# Patient Record
Sex: Male | Born: 1980 | Hispanic: Yes | Marital: Married | Smoking: Former smoker
Health system: Southern US, Community
[De-identification: ages and names within clinical notes are randomized; demographics above are authoritative.]

## PROBLEM LIST (undated history)

## (undated) DIAGNOSIS — F339 Major depressive disorder, recurrent, unspecified: Secondary | ICD-10-CM

## (undated) DIAGNOSIS — F419 Anxiety disorder, unspecified: Secondary | ICD-10-CM

## (undated) HISTORY — PX: KNEE SURGERY: SHX244

## (undated) HISTORY — DX: Major depressive disorder, recurrent, unspecified: F33.9

## (undated) HISTORY — DX: Anxiety disorder, unspecified: F41.9

## (undated) HISTORY — PX: HAND SURGERY: SHX662

---

## 2014-09-08 ENCOUNTER — Ambulatory Visit (INDEPENDENT_AMBULATORY_CARE_PROVIDER_SITE_OTHER): Payer: 59 | Admitting: Family Medicine

## 2014-09-08 ENCOUNTER — Encounter: Payer: Self-pay | Admitting: Family Medicine

## 2014-09-08 ENCOUNTER — Ambulatory Visit (HOSPITAL_BASED_OUTPATIENT_CLINIC_OR_DEPARTMENT_OTHER)
Admission: RE | Admit: 2014-09-08 | Discharge: 2014-09-08 | Disposition: A | Discharge status: Home / Self Care | Payer: 59 | Source: Ambulatory Visit | Attending: Family Medicine | Admitting: Family Medicine

## 2014-09-08 VITALS — BP 126/80 | HR 71 | Ht 72.0 in | Wt 205.0 lb

## 2014-09-08 DIAGNOSIS — M546 Pain in thoracic spine: Secondary | ICD-10-CM

## 2014-09-08 DIAGNOSIS — M549 Dorsalgia, unspecified: Secondary | ICD-10-CM

## 2014-09-08 NOTE — Patient Instructions (Signed)
You have an insertional thoracic strain. Take ibuprofen or aleve as needed. Start physical therapy and do home exercises on days you don't go to therapy. Follow up with me in 6 weeks for reevaluation.  Call me instead if you're doing well. Consider MRI if not improving as expected.

## 2014-09-13 ENCOUNTER — Encounter: Payer: Self-pay | Admitting: Family Medicine

## 2014-09-13 DIAGNOSIS — M549 Dorsalgia, unspecified: Secondary | ICD-10-CM | POA: Insufficient documentation

## 2014-09-13 NOTE — Progress Notes (Signed)
Patient ID: Devin Hoffman, male   DOB: 01/01/1981, [age] y.o.   MRN: [id number]  PCP: No primary provider on file.  Subjective:   HPI: Patient is a 33 y.o. male here for upper back pain.  Patient reports for about 2 months he has had pain between his shoulder blades. No known injury. Started shortly after he cleaned carpets in a house. Tried aleve without much benefit. No swelling or bruising. Has not tried any rehab for this. Difficult to reproduce pain but does hurt with certain movements.  History reviewed. No pertinent past medical history.  No current outpatient prescriptions on file prior to visit.   No current facility-administered medications on file prior to visit.    Past Surgical History  Procedure Laterality Date  . Knee surgery    . Hand surgery Left     No Known Allergies  History   Social History  . Marital Status: Married    Spouse Name: N/A    Number of Children: N/A  . Years of Education: N/A   Occupational History  . Not on file.   Social History Main Topics  . Smoking status: Current Some Day Smoker  . Smokeless tobacco: Not on file  . Alcohol Use: Not on file  . Drug Use: Not on file  . Sexual Activity: Not on file   Other Topics Concern  . Not on file   Social History Narrative  . No narrative on file    No family history on file.  BP 126/80  Pulse 71  Ht 6' (1.829 m)  Wt 205 lb (92.987 kg)  BMI 27.80 kg/m2  Review of Systems: See HPI above.    Objective:  Physical Exam:  Gen: NAD  Back: No gross deformity, scoliosis. TTP midline around T5 without stepoffs.  No paraspinal tenderness. FROM without pain. Strength 5/5 all upper extremity muscle groups.   Sensation intact to light touch bilaterally.    Assessment & Plan:  1. Upper back pain - radiographs negative.  Unusual in that pain seems focal and bony though no abnormalities.  He also denied sweats, chills, fevers, other medical problems.  One family member  possibly with rheumatoid arthritis but no other autoimmune disease he's aware of.  We will start with conservative treatment for insertional thoracic strain with nsaids, physical therapy.  Follow up with me in 6 weeks for reevaluation.  Consider MRI if not improving.

## 2014-09-13 NOTE — Assessment & Plan Note (Signed)
radiographs negative.  Unusual in that pain seems focal and bony though no abnormalities.  He also denied sweats, chills, fevers, other medical problems.  One family member possibly with rheumatoid arthritis but no other autoimmune disease he's aware of.  We will start with conservative treatment for insertional thoracic strain with nsaids, physical therapy.  Follow up with me in 6 weeks for reevaluation.  Consider MRI if not improving.

## 2014-09-14 ENCOUNTER — Ambulatory Visit: Payer: 59 | Attending: Family Medicine

## 2014-09-14 DIAGNOSIS — M546 Pain in thoracic spine: Secondary | ICD-10-CM | POA: Insufficient documentation

## 2014-09-14 DIAGNOSIS — Z5189 Encounter for other specified aftercare: Secondary | ICD-10-CM | POA: Insufficient documentation

## 2014-09-21 ENCOUNTER — Ambulatory Visit: Payer: 59 | Admitting: Rehabilitation

## 2014-09-21 DIAGNOSIS — Z5189 Encounter for other specified aftercare: Secondary | ICD-10-CM | POA: Diagnosis not present

## 2014-09-28 ENCOUNTER — Ambulatory Visit: Payer: 59 | Attending: Family Medicine | Admitting: Rehabilitation

## 2014-09-28 DIAGNOSIS — M546 Pain in thoracic spine: Secondary | ICD-10-CM | POA: Insufficient documentation

## 2014-09-28 DIAGNOSIS — Z5189 Encounter for other specified aftercare: Secondary | ICD-10-CM | POA: Insufficient documentation

## 2014-09-30 ENCOUNTER — Ambulatory Visit: Payer: 59 | Admitting: Rehabilitation

## 2014-09-30 DIAGNOSIS — Z5189 Encounter for other specified aftercare: Secondary | ICD-10-CM | POA: Diagnosis present

## 2014-09-30 DIAGNOSIS — M546 Pain in thoracic spine: Secondary | ICD-10-CM | POA: Diagnosis not present

## 2014-10-05 ENCOUNTER — Ambulatory Visit: Payer: 59 | Admitting: Rehabilitation

## 2014-10-07 ENCOUNTER — Ambulatory Visit: Payer: 59 | Admitting: Rehabilitation

## 2014-10-07 DIAGNOSIS — Z5189 Encounter for other specified aftercare: Secondary | ICD-10-CM | POA: Diagnosis not present

## 2014-10-12 ENCOUNTER — Ambulatory Visit: Payer: 59

## 2014-10-14 ENCOUNTER — Ambulatory Visit: Payer: 59 | Admitting: Rehabilitation

## 2014-10-19 ENCOUNTER — Ambulatory Visit: Payer: 59 | Admitting: Physical Therapy

## 2014-10-28 ENCOUNTER — Ambulatory Visit: Payer: 59 | Attending: Family Medicine | Admitting: Physical Therapy

## 2014-10-28 DIAGNOSIS — Z5189 Encounter for other specified aftercare: Secondary | ICD-10-CM | POA: Insufficient documentation

## 2014-10-28 DIAGNOSIS — M546 Pain in thoracic spine: Secondary | ICD-10-CM | POA: Insufficient documentation

## 2015-05-05 ENCOUNTER — Encounter (HOSPITAL_COMMUNITY): Payer: Self-pay | Admitting: Clinical

## 2015-05-05 ENCOUNTER — Encounter (INDEPENDENT_AMBULATORY_CARE_PROVIDER_SITE_OTHER): Payer: Self-pay

## 2015-05-05 ENCOUNTER — Ambulatory Visit (INDEPENDENT_AMBULATORY_CARE_PROVIDER_SITE_OTHER): Payer: 59 | Admitting: Clinical

## 2015-05-05 DIAGNOSIS — F411 Generalized anxiety disorder: Secondary | ICD-10-CM | POA: Diagnosis not present
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
## 2015-05-17 ENCOUNTER — Ambulatory Visit (INDEPENDENT_AMBULATORY_CARE_PROVIDER_SITE_OTHER): Payer: 59 | Admitting: Clinical

## 2015-05-17 ENCOUNTER — Encounter (HOSPITAL_COMMUNITY): Payer: Self-pay | Admitting: Clinical

## 2015-05-17 DIAGNOSIS — F411 Generalized anxiety disorder: Secondary | ICD-10-CM | POA: Diagnosis not present

## 2015-05-17 NOTE — Progress Notes (Signed)
   THERAPIST PROGRESS NOTE  Session Time: 7:02 - 8:00  Participation Level: Active  Behavioral Response: CasualAlertAnxious   Type of Therapy: Individual Therapy  Treatment Goals addressed: improve psychiatric symptoms, stress management, reduction of anxiety producing thoughts, improve unhelpful thinking patterns  Interventions: CBT, Motivational Interviewing and Other: Mindfulness and Grounding  Summary: Devin Hoffman is a 34 y.o. male who presents with Generalized Anxiety Disorder  Suicidal/Homicidal: No -without intent/plan  Therapist Response: Devin Hoffman met with clinician for an individual session. He discussed his psychiatric symptoms and  his current life events. Devin Hoffman shared that it had been his wedding anniversary and that he celebrated fathers day. Devin Hoffman shared what had gone well and also where he experienced a lot of anxiety. Devin Hoffman shared how his thoughts about perfection got in the way of "enjoying things as he should have" Devin Hoffman and clinician discussed his expectations versus the reality of what happened. Clinician introduced some basic cbt concepts. Devin Hoffman and clinician used the example to show that different interpretations of events might produce different emotions and different behaviors. Devin Hoffman and clinician discussed the challenge of catching our thoughts. Clinician introduced grounding and mindfulness techniques. Clinician explained the process, purpose, and practice. Devin Hoffman and clinician discussed some of the techniques in detail and then practiced them together. Devin Hoffman agreed to practice the techniques daily until next session. Devin Hoffman said he felt better knowing there was action he could take to improve.  Plan: Return again in 1 weeks.  Diagnosis: Axis I: generalized Anxiety Disorder    Devin Hoffman A, LCSW 05/17/2015

## 2015-05-25 ENCOUNTER — Ambulatory Visit (HOSPITAL_COMMUNITY): Payer: Self-pay | Admitting: Clinical

## 2015-06-14 ENCOUNTER — Encounter (HOSPITAL_COMMUNITY): Payer: Self-pay | Admitting: Clinical

## 2015-06-14 ENCOUNTER — Ambulatory Visit (INDEPENDENT_AMBULATORY_CARE_PROVIDER_SITE_OTHER): Payer: 59 | Admitting: Clinical

## 2015-06-14 DIAGNOSIS — F411 Generalized anxiety disorder: Secondary | ICD-10-CM

## 2015-06-14 NOTE — Progress Notes (Signed)
   THERAPIST PROGRESS NOTE  Session Time: 8:03 - 9:00  Participation Level: Active  Behavioral Response: CasualAlertAnxious  Type of Therapy: Individual Therapy  Treatment Goals addressed: improve psychiatric symptoms,  reduction of anxiety producing thoughts, improve unhelpful thinking patterns  Interventions: CBT, Motivational Interviewing and Other: Mindfulness and Grounding  Summary: Tarry Fountain is a 34 y.o. male who presents with Generalized Anxiety Disorder  Suicidal/Homicidal: No -without intent/plan  Therapist Response: Leland met with clinician for an individual session. He discussed his psychiatric symptoms, his current life events and his homework. Jhamir reported that he felt he has made a little bit of progress on his anxiety. He shared that he was starting to recognize some of his anxiety inducing thoughts. 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 He shared that he noticed when he was successful with his skills his anxiety dissipated. Elgie shared about an event that caused his anxiety to increase ( about his house and order). He shared his negative automatic thoughts about event. Client and clinician discussed how his anxiety affected his body. Client and clinician discussed when he might have formed those thoughts - an internal rule. Benedict recognized that they were given to him by his mother as a child. Client and clinician discussed whether or not he had seen others live happy without the rule. Client and clinician discussed whether or not he agreed with the rule.Client and clinician discussed the process of challenging internal rules. Graison agreed to consider the "rule" and note his thoughts for homework. Rosbel agreed to continue his grounding and mindfulness homework as well as complete another packet before next session. Jedaiah reported that his wife is recognizing his efforts to improve.   Plan: Return again in 1  weeks.  Diagnosis: Axis I: generalized Anxiety Disorder    Kentrell Hallahan A, LCSW 06/14/2015

## 2015-06-29 ENCOUNTER — Ambulatory Visit (HOSPITAL_COMMUNITY): Payer: Self-pay | Admitting: Clinical

## 2015-07-26 ENCOUNTER — Ambulatory Visit (HOSPITAL_COMMUNITY): Payer: Self-pay | Admitting: Clinical

## 2015-08-10 ENCOUNTER — Ambulatory Visit (INDEPENDENT_AMBULATORY_CARE_PROVIDER_SITE_OTHER): Payer: 59 | Admitting: Clinical

## 2015-08-10 DIAGNOSIS — F411 Generalized anxiety disorder: Secondary | ICD-10-CM

## 2015-08-16 NOTE — Progress Notes (Signed)
   THERAPIST PROGRESS NOTE  Session Time: 4:35 -5:31  Participation Level: Active  Behavioral Response: Casual and NeatAlertAnxious  Type of Therapy: Individual Therapy  Treatment Goals addressed: improve psychiatric symptoms,  reduction of anxiety producing thoughts, improve unhelpful thinking patterns  Interventions: CBT, Motivational Interviewing and Other: Mindfulness and Grounding  Summary: Maykel Reitter is a 34 y.o. male who presents with Generalized Anxiety Disorder  Suicidal/Homicidal: No -without intent/plan  Therapist Response: Maxey met with clinician for an individual session. He discussed his psychiatric symptoms, his current life events and his homework. Dereck shared that it had been some time since his last appointment due to scheduling conflicts. He shared that he has continued to do his grounding and mindfulness techniques, but did not bring his packet with him. He shared that he has continued to practice the techniques leaned in therapy. He stated that he continues to have anxiety producing thoughts but he is getting better at recognizing them. Juddson shared about his successes as well as his areas for improvement. Jewett shared that when he has been calmer and less anxious his wife has responded by being neater. He stated that this additionally decreased his anxiety. Chrisean shared about his child (69 year old son) He shared about an event that caused him a lot of anxiety. Client and clinician discussed his negative automatic thoughts. Client and clinician discussed the evidence for and against the thoughts. Jaylun and clinician discussed how this related to last sessions discussion about his "rules". Slate formulated healthier alternative ways to veiw the event.  Aldean shared his insight about how his childhood has shaped his perceptions. Harvey agreed to continue his homework until next session. Jeb shared that he feels like he is making progress.     Plan: Return  again in 1- 2 weeks.  Diagnosis: Axis I: generalized Anxiety Disorder    Powell,Frances A, LCSW 08/16/2015

## 2015-08-17 ENCOUNTER — Encounter (HOSPITAL_COMMUNITY): Payer: Self-pay | Admitting: Clinical

## 2015-09-12 ENCOUNTER — Encounter (HOSPITAL_COMMUNITY): Payer: Self-pay | Admitting: Clinical

## 2015-09-12 ENCOUNTER — Ambulatory Visit (INDEPENDENT_AMBULATORY_CARE_PROVIDER_SITE_OTHER): Payer: 59 | Admitting: Clinical

## 2015-09-12 DIAGNOSIS — F411 Generalized anxiety disorder: Secondary | ICD-10-CM | POA: Diagnosis not present

## 2015-09-12 NOTE — Progress Notes (Signed)
   THERAPIST PROGRESS NOTE  Session Time: 4:33- 5:28  Participation Level: Active  Behavioral Response: CasualAlertNA  Type of Therapy: Individual Therapy  Treatment Goals addressed: improve psychiatric symptoms, stress management, reduction of anxiety producing thoughts, improve unhelpful thinking patterns  Interventions: CBT, Motivational Interviewing   Summary: Devin Hoffman is a 34 y.o. male who presents with Generalized Anxiety Disorder  Suicidal/Homicidal: No -without intent/plan  Therapist Response: Devin Hoffman met with clinician for an individual session. He discussed his psychiatric symptoms, his current life events and his homework. Devin Hoffman shared that he has been diligent in practicing his grounding and mindfulness techniques as well as the skills learned in therapy. He shared about his successes as well as areas for improvement. Client and clinician discussed the areas for improvement. Clinician asked open ended questions and Devin Hoffman was able to formulate healthier approaches to the challenging areas.A lot of Devin Hoffman's anxiety is about relationships. Devin Hoffman had the insight that his belief about what motivates others to behave other that he expects might not be accurate. Devin Hoffman discussed his relationship with his father. He shared feelings of disappointment and anxiety about his relationship with his father. Devin Hoffman reflected on how his father's absence not only affects him but his child. Devin Hoffman shared how his anxiety about his role as a father is increased by his fears about being like his father. Client and clinician discussed his negative automatic thoughts about his role, he identified the evidence for and against the thoughts and was able to formulate healthier alternative thoughts. Devin Hoffman and clinician discussed the other father figures in his life and the fact that he could choose to connect and take cues from them if he like. Devin Hoffman agreed to continue his homework until next  session     Plan: Return again in 2-3 weeks.  Diagnosis: Axis I: generalized Anxiety Disorder     Deshannon Seide A, LCSW 09/12/2015

## 2015-10-12 ENCOUNTER — Ambulatory Visit (HOSPITAL_COMMUNITY): Payer: Self-pay | Admitting: Clinical

## 2016-02-28 ENCOUNTER — Encounter (HOSPITAL_COMMUNITY): Payer: Self-pay | Admitting: Emergency Medicine

## 2016-02-28 DIAGNOSIS — Y9389 Activity, other specified: Secondary | ICD-10-CM | POA: Diagnosis not present

## 2016-02-28 DIAGNOSIS — F17210 Nicotine dependence, cigarettes, uncomplicated: Secondary | ICD-10-CM | POA: Insufficient documentation

## 2016-02-28 DIAGNOSIS — W228XXA Striking against or struck by other objects, initial encounter: Secondary | ICD-10-CM | POA: Diagnosis not present

## 2016-02-28 DIAGNOSIS — Y9289 Other specified places as the place of occurrence of the external cause: Secondary | ICD-10-CM | POA: Diagnosis not present

## 2016-02-28 DIAGNOSIS — M791 Myalgia: Secondary | ICD-10-CM | POA: Insufficient documentation

## 2016-02-28 DIAGNOSIS — S0990XA Unspecified injury of head, initial encounter: Secondary | ICD-10-CM | POA: Insufficient documentation

## 2016-02-28 DIAGNOSIS — R55 Syncope and collapse: Secondary | ICD-10-CM | POA: Insufficient documentation

## 2016-02-28 DIAGNOSIS — Y998 Other external cause status: Secondary | ICD-10-CM | POA: Insufficient documentation

## 2016-02-28 LAB — CBC
HCT: 39.8 % (ref 39.0–52.0)
HEMOGLOBIN: 14.3 g/dL (ref 13.0–17.0)
MCH: 32.1 pg (ref 26.0–34.0)
MCHC: 35.9 g/dL (ref 30.0–36.0)
MCV: 89.4 fL (ref 78.0–100.0)
Platelets: 191 10*3/uL (ref 150–400)
RBC: 4.45 MIL/uL (ref 4.22–5.81)
RDW: 12.9 % (ref 11.5–15.5)
WBC: 6.3 10*3/uL (ref 4.0–10.5)

## 2016-02-28 LAB — BASIC METABOLIC PANEL
ANION GAP: 11 (ref 5–15)
BUN: 14 mg/dL (ref 6–20)
CO2: 24 mmol/L (ref 22–32)
Calcium: 9.1 mg/dL (ref 8.9–10.3)
Chloride: 100 mmol/L — ABNORMAL LOW (ref 101–111)
Creatinine, Ser: 1.14 mg/dL (ref 0.61–1.24)
Glucose, Bld: 124 mg/dL — ABNORMAL HIGH (ref 65–99)
POTASSIUM: 4.1 mmol/L (ref 3.5–5.1)
SODIUM: 135 mmol/L (ref 135–145)

## 2016-02-28 LAB — URINALYSIS, ROUTINE W REFLEX MICROSCOPIC
BILIRUBIN URINE: NEGATIVE
Glucose, UA: NEGATIVE mg/dL
HGB URINE DIPSTICK: NEGATIVE
KETONES UR: NEGATIVE mg/dL
Leukocytes, UA: NEGATIVE
Nitrite: NEGATIVE
Protein, ur: NEGATIVE mg/dL
SPECIFIC GRAVITY, URINE: 1.009 (ref 1.005–1.030)
pH: 7 (ref 5.0–8.0)

## 2016-02-28 NOTE — ED Notes (Signed)
Pt. reports syncopal episode approx. 30 seconds witnessed by family this evening , alert and oriented at arrival , speech clear /no facial asymmetry , equal strong grips with no arm drift , presents with mild headache and bilateral leg pain this evening . Denies fever or chills/ ambulatory .

## 2016-02-29 ENCOUNTER — Encounter (HOSPITAL_COMMUNITY): Payer: Self-pay | Admitting: Emergency Medicine

## 2016-02-29 ENCOUNTER — Emergency Department (HOSPITAL_COMMUNITY)
Admission: EM | Admit: 2016-02-29 | Discharge: 2016-02-29 | Disposition: A | Discharge status: Home / Self Care | Payer: Commercial Managed Care - HMO | Attending: Emergency Medicine | Admitting: Emergency Medicine

## 2016-02-29 DIAGNOSIS — R55 Syncope and collapse: Secondary | ICD-10-CM

## 2016-02-29 NOTE — Discharge Instructions (Signed)
Syncope, commonly known as fainting, is a temporary loss of consciousness. It occurs when the blood flow to the brain is reduced. Vasovagal syncope (also called neurocardiogenic syncope) is a fainting spell in which the blood flow to the brain is reduced because of a sudden drop in heart rate and blood pressure. Vasovagal syncope occurs when the brain and the cardiovascular system (blood vessels) do not adequately communicate and respond to each other. This is the most common cause of fainting. It often occurs in response to fear or some other type of emotional or physical stress. The body has a reaction in which the heart starts beating too slowly or the blood vessels expand, reducing blood pressure. This type of fainting spell is generally considered harmless. However, injuries can occur if a person takes a sudden fall during a fainting spell.   CAUSES   Vasovagal syncope occurs when a person's blood pressure and heart rate decrease suddenly, usually in response to a trigger. Many things and situations can trigger an episode. Some of these include:    Pain.    Fear.    The sight of blood or medical procedures, such as blood being drawn from a vein.    Common activities, such as coughing, swallowing, stretching, or going to the bathroom.    Emotional stress.    Prolonged standing, especially in a warm environment.    Lack of sleep or rest.    Prolonged lack of food.    Prolonged lack of fluids.    Recent illness.   The use of certain drugs that affect blood pressure, such as cocaine, alcohol, marijuana, inhalants, and opiates.   SYMPTOMS   Before the fainting episode, you may:    Feel dizzy or light headed.    Become pale.   Sense that you are going to faint.    Feel like the room is spinning.    Have tunnel vision, only seeing directly in front of you.    Feel sick to your stomach (nauseous).    See spots or slowly lose vision.    Hear ringing in your ears.    Have a headache.     Feel warm and sweaty.    Feel a sensation of pins and needles.  During the fainting spell, you will generally be unconscious for no longer than a couple minutes before waking up and returning to normal. If you get up too quickly before your body can recover, you may faint again. Some twitching or jerky movements may occur during the fainting spell.   DIAGNOSIS   Your health care provider will ask about your symptoms, take a medical history, and perform a physical exam. Various tests may be done to rule out other causes of fainting. These may include blood tests and tests to check the heart, such as electrocardiography, echocardiography, and possibly an electrophysiology study. When other causes have been ruled out, a test may be done to check the body's response to changes in position (tilt table test).  TREATMENT   Most cases of vasovagal syncope do not require treatment. Your health care provider may recommend ways to avoid fainting triggers and may provide home strategies for preventing fainting. If you must be exposed to a possible trigger, you can drink additional fluids to help reduce your chances of having an episode of vasovagal syncope. If you have warning signs of an oncoming episode, you can respond by positioning yourself favorably (lying down).  If your fainting spells continue, you may be   given medicines to prevent fainting. Some medicines may help make you more resistant to repeated episodes of vasovagal syncope. Special exercises or compression stockings may be recommended. In rare cases, the surgical placement of a pacemaker is considered.  HOME CARE INSTRUCTIONS    Learn to identify the warning signs of vasovagal syncope.    Sit or lie down at the first warning sign of a fainting spell. If sitting, put your head down between your legs. If you lie down, swing your legs up in the air to increase blood flow to the brain.    Avoid hot tubs and saunas.   Avoid prolonged standing.   Drink  enough fluids to keep your urine clear or pale yellow. Avoid caffeine.   Increase salt in your diet as directed by your health care provider.    If you have to stand for a long time, perform movements such as:     Crossing your legs.     Flexing and stretching your leg muscles.     Squatting.     Moving your legs.     Bending over.    Only take over-the-counter or prescription medicines as directed by your health care provider. Do not suddenly stop any medicines without asking your health care provider first.  SEEK MEDICAL CARE IF:    Your fainting spells continue or happen more frequently in spite of treatment.    You lose consciousness for more than a couple minutes.   You have fainting spells during or after exercising or after being startled.    You have new symptoms that occur with the fainting spells, such as:     Shortness of breath.    Chest pain.     Irregular heartbeat.    You have episodes of twitching or jerky movements that last longer than a few seconds.   You have episodes of twitching or jerky movements without obvious fainting.  SEEK IMMEDIATE MEDICAL CARE IF:    You have injuries or bleeding after a fainting spell.    You have episodes of twitching or jerky movements that last longer than 5 minutes.    You have more than one spell of twitching or jerky movements before returning to consciousness after fainting.     This information is not intended to replace advice given to you by your health care provider. Make sure you discuss any questions you have with your health care provider.     Document Released: 10/29/2012 Document Revised: 03/29/2015 Document Reviewed: 10/29/2012  Elsevier Interactive Patient Education 2016 [employer]
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
## 2016-03-27 ENCOUNTER — Ambulatory Visit (INDEPENDENT_AMBULATORY_CARE_PROVIDER_SITE_OTHER): Payer: Commercial Managed Care - HMO | Admitting: Family Medicine

## 2016-03-27 ENCOUNTER — Encounter: Payer: Self-pay | Admitting: Family Medicine

## 2016-03-27 VITALS — BP 124/74 | Ht 72.0 in | Wt 215.0 lb

## 2016-03-27 DIAGNOSIS — M25561 Pain in right knee: Secondary | ICD-10-CM | POA: Diagnosis not present

## 2016-03-27 MED ORDER — METHYLPREDNISOLONE ACETATE 40 MG/ML IJ SUSP
40.0000 mg | Freq: Once | INTRAMUSCULAR | Status: AC
  Administered 2016-03-27: 40 mg via INTRA_ARTICULAR

## 2016-03-27 NOTE — Progress Notes (Signed)
Patient ID: Devin Hoffman, male   DOB: 05-29-1981, 35 y.o.   MRN: [id number] CC: Right knee pain  HPI: 35 yo man presents with two weeks of recurrent, medial right knee pain. S/p medial meniscectomy in 2007. Had two steroid injections into the knee due to pain over the following 2 years and had no significant pain until recently. Pain has been gradual over past two weeks with no new injury. Anterior medial pain is often constant, especially after walking/standing all day and with running. Twisting in particular causes him discomfort. He has not tried any oral medications. Rest is the only thing which seems to make it better. No swelling, catching, locking or popping, no new injury. Ankle and hip are ok, no left sided symptoms. Desires another CSI today. States his last Xrays were in 2010 and no abnormalities or degeneration were noted.   ROS: No other acute complaints today, feels otherwise well. He denies any fevers chills or night sweats.  PMH: Denies. Surgery as above.  PE: BP 124/74 mmHg  Ht 6' (1.829 m)  Wt 215 lb (97.523 kg)  BMI 29.15 kg/m2 GEN: WDWN man, NAD, pleasant and cooperative. CV/PULM: Nonlabored breathing, peripheral circulation is grossly intact, skin is warm and dry  MSK: Active flexion/extension of knee is nontender except mild discomfort at full flexion. Valgus strain at 0 causes pain but MCL has solid endpoint. No pain with valgus stress at 30. Likewise, ACL, LCL and PCL demonstrate no instability or laxity. Medial joint line pain. Thessaly is painful. Good quad muscle development, symmetrical.   Ultrasound: Short and long axis views of right knee obtained. There is a low-grade effusion identified in the suprapatellar pouch. Lateral meniscus intact without irregularity. Quad and patellar tendons unremarkable. No significant effusion. Evidence of medial meniscectomy with some degeneration in surrounding area.   Consent obtained and verified. Sterile betadine prep.  Furthur cleansed with alcohol. Topical analgesic spray: Ethyl chloride. Joint: Right knee Approached in typical fashion with: Anterior medial Completed without difficulty Meds: 2 mL of methylprednisolone and 4 mL of 1% Xylocaine Needle: 25-gauge 1.5 inch Aftercare instructions and Red flags advised.  A/P.  1. Right knee pain. Likely some degenerative pain following remote meniscectomy. No mechanical symptoms. Encouraged as needed NSAID use if not daily, compression if helpful, icing after exercise, and quad strengthening exercises (straight leg raises) which do not cause deep flexion (lunges, squats, etc.).  2:4 cc Medrol/Marcaine injection. Consented and tolerated well.   Return as needed.

## 2017-03-13 ENCOUNTER — Encounter (HOSPITAL_COMMUNITY): Payer: Self-pay | Admitting: Clinical

## 2017-03-13 ENCOUNTER — Ambulatory Visit (INDEPENDENT_AMBULATORY_CARE_PROVIDER_SITE_OTHER): Payer: 59 | Admitting: Clinical

## 2017-03-13 DIAGNOSIS — F411 Generalized anxiety disorder: Secondary | ICD-10-CM

## 2017-03-13 DIAGNOSIS — F4320 Adjustment disorder, unspecified: Secondary | ICD-10-CM | POA: Diagnosis not present

## 2017-03-13 DIAGNOSIS — F4321 Adjustment disorder with depressed mood: Secondary | ICD-10-CM
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
## 2017-03-27 ENCOUNTER — Encounter (HOSPITAL_COMMUNITY): Payer: Self-pay | Admitting: Clinical

## 2017-03-27 ENCOUNTER — Ambulatory Visit (INDEPENDENT_AMBULATORY_CARE_PROVIDER_SITE_OTHER): Payer: 59 | Admitting: Clinical

## 2017-03-27 DIAGNOSIS — F4320 Adjustment disorder, unspecified: Secondary | ICD-10-CM | POA: Diagnosis not present

## 2017-03-27 DIAGNOSIS — F411 Generalized anxiety disorder: Secondary | ICD-10-CM | POA: Diagnosis not present

## 2017-03-27 DIAGNOSIS — F4321 Adjustment disorder with depressed mood: Secondary | ICD-10-CM

## 2017-03-27 NOTE — Progress Notes (Signed)
   THERAPIST PROGRESS NOTE  Session Time: 2:36 - 3:30  Participation Level: Active  Behavioral Response: NeatAlertAnxious and Depressed  Type of Therapy: Individual Therapy  Treatment Goals addressed: improve psychiatric symptoms,  improve unhelpful thought patterns, discuss and process grief, express a full range of emotions, learn about diagnosis, healthy coping skills  Interventions: Motivational interviewing, cognitive behavioral therapy, psychoeducation  Summary: Jordany Russett is a 36 y.o. male who presents with Generalized Anxiety Disorder and Grief .   Suicidal/Homicidal: Nowithout intent/plan  Therapist Response: Audelia Hives met with clinician for an individual therapy appointment. He discussed his psychiatric symptoms, his current life events. Dorsel shared that he was experiencing anxiety and "numbness". He shared that he noticed he was isolating. Clinician asked open ended questions and he shared that he thinks it is related to his grief. Client and clinician discussed the grief process. Clinician asked open ended questions and Nivek shared that he was not sharing his emotions with anyone. Client and clinician discussed his negative automatic thoughts about expressing his emotions. Client and clinician discussed how he developed his patterns. Clinician asked open ended questions and Usbaldo discussed the evidence for and against the negative thoughts. He was then able to formulate healthier alternative plans. Client and clinician discussed who would be safe to share with. He then formulated a plan to try to share his emotions with one or two trusted people.   Plan: Return again in 1-2  weeks.  Diagnosis: Axis I: Generalized Anxiety Disorder and Grief        Avana Kreiser A, LCSW 03/27/2017

## 2017-04-29 ENCOUNTER — Encounter (HOSPITAL_COMMUNITY): Payer: Self-pay | Admitting: Clinical

## 2017-04-29 ENCOUNTER — Ambulatory Visit (INDEPENDENT_AMBULATORY_CARE_PROVIDER_SITE_OTHER): Payer: 59 | Admitting: Clinical

## 2017-04-29 DIAGNOSIS — F411 Generalized anxiety disorder: Secondary | ICD-10-CM

## 2017-04-29 DIAGNOSIS — F4321 Adjustment disorder with depressed mood: Secondary | ICD-10-CM | POA: Diagnosis not present

## 2017-04-29 NOTE — Progress Notes (Signed)
   THERAPIST PROGRESS NOTE  Session Time: 3:34 -4:30  Participation Level: Active  Behavioral Response: CasualAlertAnxious  Type of Therapy: Individual Therapy  Treatment Goals addressed: improve psychiatric symptoms, reduce anxiety, stress management skills, improve unhelpful thought patterns, discuss and process grief, express a full range of emotions, learn about diagnosis, healthy coping skills  Interventions: Motivational interviewing, cognitive behavioral therapy,  psychoeducation  Summary: Devin Hoffman is a 36 y.o. male who presents with Generalized Anxiety Disorder and Grief .   Suicidal/Homicidal: Nowithout intent/plan  Therapist Response: Devin Hoffman met with clinician for an individual therapy appointment. He discussed his psychiatric symptoms and his current life events. Devin Hoffman shared that he has had some good days and some bad days with his symptoms. Clinician asked open ended questions and Devin Hoffman shared about his symptoms, when they began and his behaviors. Clinician asked open ended questions and Devin Hoffman explored the events prior to his symptoms increasing. He had the insight that the symptoms increased when he avoided others whom he had selected to share his grief with. Devin Hoffman and clinician discussed the grief process and how isolation can increase symptoms of depression. Devin Hoffman and clinician discussed his fears about speaking about it. Devin Hoffman and clinician discussed fear as protection from what our brain's perceive as danger. Clinician asked open ended questions and Devin Hoffman identified safe ways for him to challenge his fears. Devin Hoffman and clinician discussed stress reduction techniques.  Plan: Return again in 1-2  weeks.  Diagnosis: Axis I: Generalized Anxiety Disorder and Grief     Devin Hoffman A, LCSW 04/29/2017

## 2017-05-28 ENCOUNTER — Encounter (HOSPITAL_COMMUNITY): Payer: Self-pay | Admitting: Clinical

## 2017-05-28 ENCOUNTER — Ambulatory Visit (INDEPENDENT_AMBULATORY_CARE_PROVIDER_SITE_OTHER): Payer: 59 | Admitting: Clinical

## 2017-05-28 DIAGNOSIS — F411 Generalized anxiety disorder: Secondary | ICD-10-CM

## 2017-05-28 DIAGNOSIS — F4320 Adjustment disorder, unspecified: Secondary | ICD-10-CM | POA: Diagnosis not present

## 2017-05-28 DIAGNOSIS — F4321 Adjustment disorder with depressed mood: Secondary | ICD-10-CM

## 2017-05-28 NOTE — Progress Notes (Signed)
   THERAPIST PROGRESS NOTE  Session Time: 4:31 - 5 :27  Participation Level: Active  Behavioral Response: CasualAlertDepressed  Type of Therapy: Individual Therapy  Treatment Goals addressed: improve psychiatric symptoms, reduce anxiety,  improve unhelpful thought patterns, discuss and process grief, express a full range of emotions, learn about diagnosis, healthy coping skills  Interventions: Motivational interviewing, cognitive behavioral therapy, , psychoeducation  Summary: Devin Hoffman is a 36 y.o. male who presents with Generalized Anxiety Disorder and Grief .   Suicidal/Homicidal: Nowithout intent/plan  Therapist Response: Audelia Hives met with clinician for an individual therapy appointment. He discussed his psychiatric symptoms and his current life events.Quran shared that he had begun taking medication that his GP prescribed. He shared that it reduced his anxiety and irritability but decreased his sex drive and performance. He shared his doctor told him to stop using  the drug but instead he has been tapering down because he was concerned about side affects from stopping cold [place]. He shared he was interested and willing to try another medication. Clinician encouraged him to see a Psychiatrist for his mental health medication. Srikar has Anxiety and some OCD symptoms but at time of assessment did not meet criteria. Client and clinician discussed his diagnosis and symptoms. Clinician informed Ledger about her upcoming departure from [place]. Clinician asked open ended questions and Weiland shared his thoughts and emotions. Client and clinician discussed his options for continuing therapy with another clinician.  Client and clinician reviewed and discussed skills learned in therapy. Clinician asked open ended questions. Tremon shared about the skills he has been using, how they are affective. He also shared about some areas he continues to have challenges. Clinician asked his desired  outcomes. He shared his desired out comes. Clinician asked open ended questions and Zaven shared which of his thoughts and behaviors were in alignment with his desired outcomes. Clinician asked open ended questions and Warwick identified some of his resistance as well as healthier alternative thoughts and behaviors.  Client and clinician discussed his current process with his grief. Client and clinician discussed his expression of his emotions. 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  Plan: Return again in 3-4  weeks.  Diagnosis: Axis I: Generalized Anxiety Disorder and Grief     Dalphine Cowie A, LCSW 05/28/2017

## 2017-07-11 ENCOUNTER — Encounter (HOSPITAL_COMMUNITY): Payer: Self-pay | Admitting: Psychiatry

## 2017-07-11 ENCOUNTER — Ambulatory Visit (INDEPENDENT_AMBULATORY_CARE_PROVIDER_SITE_OTHER): Payer: 59 | Admitting: Psychiatry

## 2017-07-11 VITALS — BP 118/76 | HR 83 | Ht 71.75 in | Wt 223.0 lb

## 2017-07-11 DIAGNOSIS — F429 Obsessive-compulsive disorder, unspecified: Secondary | ICD-10-CM

## 2017-07-11 DIAGNOSIS — Z733 Stress, not elsewhere classified: Secondary | ICD-10-CM | POA: Diagnosis not present

## 2017-07-11 DIAGNOSIS — Z634 Disappearance and death of family member: Secondary | ICD-10-CM

## 2017-07-11 DIAGNOSIS — F3341 Major depressive disorder, recurrent, in partial remission: Secondary | ICD-10-CM | POA: Diagnosis not present

## 2017-07-11 DIAGNOSIS — F17210 Nicotine dependence, cigarettes, uncomplicated: Secondary | ICD-10-CM

## 2017-07-11 DIAGNOSIS — Z811 Family history of alcohol abuse and dependence: Secondary | ICD-10-CM

## 2017-07-11 DIAGNOSIS — Z810 Family history of intellectual disabilities: Secondary | ICD-10-CM

## 2017-07-11 MED ORDER — VORTIOXETINE HBR 10 MG PO TABS: 10.0000 mg | ORAL_TABLET | Freq: Every day | ORAL | 1 refills | Status: DC

## 2017-07-11 NOTE — Progress Notes (Signed)
Psychiatric Initial Adult Assessment   Patient Identification: Devin Hoffman MRN:  [id number] Date of Evaluation:  07/11/2017 Referral Source: therapist Chief Complaint:  Depression, anxiety Chief Complaint    Depression     Visit Diagnosis:    ICD-10-CM   1. Recurrent major depressive disorder, in partial remission (HCC) F33.41 vortioxetine HBr (TRINTELLIX) 10 MG TABS    History of Present Illness:  Devin Hoffman is a 36 year old male with a history of generalized anxiety disorder with depressive features. He had a flare in his mood symptoms earlier this year when his grandmother passed away. He shares that his grandmother helped raise him, and was more like a maternal figure to him. He reports that he was working with Tomma Lightning and individual therapy which was helpful.  Medications, he had been on Cymbalta which was helpful for his depression but he had significant sexual side effects. This was the first medication he had ever been on, and they switched him to Wellbutrin due to the sexual side effects. He reports he has been on Wellbutrin 150 mg XL for the past one month, and while the sexual side effects have improved, he notices that his mood is not as good as it was on Cymbalta. He reports that he continues to have low motivation exercise, has difficulty sleeping, and notices that he's gained some weight.  He reports that he and his wife have a good relationship, they have a 56-year-old son. They're considering having another child which has been somewhat stressful to him, but he is also excited about the prospects of this. 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  Spent time with the patient reviewing his current mood symptoms, which continued to be some depressive symptoms, generalized anxiety and worry, difficulty sleeping, loss of interest in previously enjoyable  activities, poor concentration, stress eating. He denies any safety issues. We spent time discussing Trintellix and the risks and benefits associated with this, the reduced side effect profile. Agreed to start at 5 mg daily and titrate to 10 mg daily in 1 week as tolerated.  He agrees to follow-up with Clinical research associate in 3 months  Associated Signs/Symptoms: Depression Symptoms:  depressed mood, anhedonia, fatigue, difficulty concentrating, weight gain, (Hypo) Manic Symptoms:  none Anxiety Symptoms:  Excessive Worry, Psychotic Symptoms:  none PTSD Symptoms: Negative  Past Psychiatric History: No prior psychiatric treatment, individual therapy in this clinic  Previous Psychotropic Medications: Yes   Substance Abuse History in the last 12 months:  No.  Consequences of Substance Abuse: Negative  Past Medical History: History reviewed. No pertinent past medical history.  Past Surgical History:  Procedure Laterality Date  . HAND SURGERY Left   . KNEE SURGERY      Family Psychiatric History: As below  Family History:  Family History  Problem Relation Age of Onset  . Alcohol abuse Father   . Autism spectrum disorder Brother     Social History:   Social History   Social History  . Marital status: Married    Spouse name: N/A  . Number of children: N/A  . Years of education: N/A   Social History Main Topics  . Smoking status: Current Some Day Smoker    Types: Cigarettes  . Smokeless tobacco: Never Used  . Alcohol use No  . Drug use: No  . Sexual activity: Yes    Partners: Female  Birth control/ protection: None   Other Topics Concern  . None   Social History Narrative  . 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 vortioxetine HBr (TRINTELLIX) 10 MG TABS Take 1 tablet (10 mg total) by mouth daily. 90 tablet 1   No current facility-administered medications for this visit.     Neurologic: Headache: Negative Seizure: Negative Paresthesias:Negative  Musculoskeletal: Strength & Muscle Tone: within normal limits Gait & Station: normal Patient leans: N/A  Psychiatric Specialty Exam: ROS  Blood pressure 118/76, pulse 83, height 5' 11.75" (1.822 m), weight 223 lb (101.2 kg), SpO2 99 %.Body mass index is 30.46 kg/m.  General Appearance: Casual and Fairly Groomed  Eye Contact:  Good  Speech:  Clear and Coherent  Volume:  Normal  Mood:  Anxious and Euthymic  Affect:  Appropriate and Congruent  Thought Process:  Goal Directed  Orientation:  Full (Time, Place, and Person)  Thought Content:  Logical  Suicidal Thoughts:  No  Homicidal Thoughts:  No  Memory:  Immediate;   Good  Judgement:  Good  Insight:  Good  Psychomotor Activity:  Normal  Concentration:  Attention Span: Good  Recall:  Good  Fund of Knowledge:Good  Language: Good  Akathisia:  Negative  Handed:  Right  AIMS (if indicated):  0  Assets:  Communication Skills Desire for Improvement Financial Resources/Insurance Housing Intimacy Leisure Time Physical Health Resilience Social Support Talents/Skills Transportation Vocational/Educational  ADL's:  Intact  Cognition: WNL  Sleep:  5-6 hours    Treatment Plan Summary: Devin Hoffman is a 36 year old male with a history of generalized anxiety disorder with depressive features, grief, in the setting of recent family losses. He presents today for medication management assessment. He tends to be somewhat of a worrier and describes himself as a fairly "OCD" individual. He does not present with typical features of OCD, but does seem to be more along the lines of an anxious  individual. Wellbutrin has not agreed with him, and well Cymbalta did help his mood symptoms, there was significant sexual side effects. I believe he would fair best on an SSRI, and we agreed to start Trintellix as below and follow up in 3 months.  1. Recurrent major depressive disorder, in partial remission (HCC)    Discontinue Wellbutrin Initiate Trintellix 5 mg 1 week, then increase to 10 mg daily Follow-up in 3 months  Burnard LeighAlexander Arya Intisar Claudio, MD 8/16/20184:16 PM

## 2017-07-12 ENCOUNTER — Telehealth (HOSPITAL_COMMUNITY): Payer: Self-pay

## 2017-07-12 DIAGNOSIS — F3341 Major depressive disorder, recurrent, in partial remission: Secondary | ICD-10-CM

## 2017-07-12 NOTE — Telephone Encounter (Signed)
Medication problem - Fax received for prior authorization request for pt's prescribed Trintellix 10 mg tablet daily, as this was denied for preferred medications; Citalopram, Fluoxetine, Sertraline, Venlafaxine, Escitalopram, Paroxetine or Duloxetine.  Patient's insurance rejected the request due to need for trial of other SSRI medications.  Patient from documentation has failed Cymbalta due to sexual side effects and Wellbutrin was not fully effective.

## 2017-07-15 NOTE — Telephone Encounter (Signed)
Yes, the next best option would be Fluoxetine - we can start at 20 mg daily

## 2017-07-16 MED ORDER — FLUOXETINE HCL 20 MG PO CAPS: 20.0000 mg | ORAL_CAPSULE | Freq: Every day | ORAL | 1 refills | Status: DC

## 2017-07-16 NOTE — Telephone Encounter (Signed)
Telephone call with patient after speaking with Dr.Eksir to inform patient his Trintellix was not covered by his insurance so Dr. Rene Kocher wanted patient to start Fluoxetine 20 mg, one a day, #90 with 1 refill and to call back if any problems or side effects to the medication.  Reviewed the medication with patient and agreed to send new order to patient's [facility] on [address].  New order for Fluoxetine 20 mg, one a day, #90 with 1 refill e-scribed as verbally ordered from Dr. Rene Kocher this date to patient's [facility].  Patient to call back if any problem with new start of medication.

## 2017-07-16 NOTE — Addendum Note (Signed)
Addended by: Wilder Glade on: 07/16/2017 09:39 AM   Modules accepted: Orders

## 2017-10-10 ENCOUNTER — Ambulatory Visit (HOSPITAL_COMMUNITY): Payer: Self-pay | Admitting: Psychiatry

## 2018-04-05 ENCOUNTER — Encounter (HOSPITAL_COMMUNITY): Payer: Self-pay

## 2018-04-05 ENCOUNTER — Emergency Department (HOSPITAL_COMMUNITY)
Admission: EM | Admit: 2018-04-05 | Discharge: 2018-04-06 | Disposition: A | Discharge status: Home / Self Care | Payer: BLUE CROSS/BLUE SHIELD | Attending: Emergency Medicine | Admitting: Emergency Medicine

## 2018-04-05 ENCOUNTER — Emergency Department (HOSPITAL_COMMUNITY): Payer: BLUE CROSS/BLUE SHIELD

## 2018-04-05 ENCOUNTER — Other Ambulatory Visit: Payer: Self-pay

## 2018-04-05 DIAGNOSIS — R51 Headache: Secondary | ICD-10-CM | POA: Insufficient documentation

## 2018-04-05 DIAGNOSIS — R519 Headache, unspecified: Secondary | ICD-10-CM

## 2018-04-05 DIAGNOSIS — R0789 Other chest pain: Secondary | ICD-10-CM | POA: Insufficient documentation

## 2018-04-05 DIAGNOSIS — M79605 Pain in left leg: Secondary | ICD-10-CM | POA: Insufficient documentation

## 2018-04-05 DIAGNOSIS — Z87891 Personal history of nicotine dependence: Secondary | ICD-10-CM | POA: Insufficient documentation

## 2018-04-05 LAB — CBC
HCT: 40.8 % (ref 39.0–52.0)
Hemoglobin: 14.3 g/dL (ref 13.0–17.0)
MCH: 31.6 pg (ref 26.0–34.0)
MCHC: 35 g/dL (ref 30.0–36.0)
MCV: 90.3 fL (ref 78.0–100.0)
Platelets: 198 10*3/uL (ref 150–400)
RBC: 4.52 MIL/uL (ref 4.22–5.81)
RDW: 12.9 % (ref 11.5–15.5)
WBC: 6 10*3/uL (ref 4.0–10.5)

## 2018-04-05 LAB — BASIC METABOLIC PANEL
Anion gap: 9 (ref 5–15)
BUN: 15 mg/dL (ref 6–20)
CO2: 25 mmol/L (ref 22–32)
Calcium: 9.1 mg/dL (ref 8.9–10.3)
Chloride: 105 mmol/L (ref 101–111)
Creatinine, Ser: 1.11 mg/dL (ref 0.61–1.24)
GFR calc Af Amer: >60 mL/min (ref >60)
GLUCOSE: 90 mg/dL (ref 65–99)
Potassium: 3.8 mmol/L (ref 3.5–5.1)
Sodium: 139 mmol/L (ref 135–145)

## 2018-04-05 NOTE — ED Provider Notes (Signed)
[facility] EMERGENCY DEPARTMENT Provider Note   CSN: [id number] Arrival date & time: 04/05/18  1716     History   Chief Complaint Chief Complaint  Patient presents with  . Chest Pain    HPI Devin Hoffman is a 37 y.o. male.  Patient who is otherwise healthy presents with symptoms that started at 2:00 pm this afternoon (04/05/18) while at rest of left chest pain that radiated to left shoulder blade and arm. No SOB, nausea or diaphoresis. Symptoms remain constant without getting worse or better. He reports with the exception of palpation that makes the pain worse, there are no modifying factors.  Later in the afternoon he states his left leg "felt funny", not really tingling or numb but different from the right leg. No overt weakness or falls. He also reports headaches that initially were left sided, involving the frontal area and extended to cervical neck. The headache is intermitent and changes locations. No headache at the present time.  No history of headaches. He states he became concerned about the left sided nature of the symptoms because his mother had a stroke when she was in her 3's.   The history is provided by the patient. No language interpreter was used.  Chest Pain   Associated symptoms include headaches. Pertinent negatives include no abdominal pain, no fever, no nausea, no shortness of breath and no vomiting.    History reviewed. No pertinent past medical history.  Patient Active Problem List   Diagnosis Date Noted  . Upper back pain 09/13/2014    Past Surgical History:  Procedure Laterality Date  . HAND SURGERY Left   . KNEE SURGERY          Home Medications    Prior to Admission medications   Medication Sig Start Date End Date Taking? Authorizing Provider  FLUoxetine (PROZAC) 20 MG capsule Take 1 capsule (20 mg total) by mouth daily. Patient not taking: Reported on 04/05/2018 07/16/17   Burnard Leigh, MD    oxyCODONE-acetaminophen (PERCOCET/ROXICET) 5-325 MG tablet Take 1 tablet by mouth every 4 (four) hours as needed for severe pain. 04/06/18   Elpidio Anis, PA-C    Family History Family History  Problem Relation Age of Onset  . Alcohol abuse Father   . Autism spectrum disorder Brother     Social History Social History   Tobacco Use  . Smoking status: Former Smoker    Types: Cigarettes    Last attempt to quit: 04/05/2013    Years since quitting: 5.0  . Smokeless tobacco: Never Used  Substance Use Topics  . Alcohol use: No    Frequency: Never  . Drug use: No     Allergies   Patient has no known allergies.   Review of Systems Review of Systems  Constitutional: Negative for chills and fever.  HENT: Negative.   Eyes: Negative for visual disturbance.  Respiratory: Negative.  Negative for shortness of breath.   Cardiovascular: Positive for chest pain.  Gastrointestinal: Negative.  Negative for abdominal pain, nausea and vomiting.  Musculoskeletal: Negative.   Skin: Negative.   Neurological: Positive for headaches. Negative for facial asymmetry, speech difficulty and light-headedness.       See HPI.     Physical Exam Updated Vital Signs BP 116/84   Pulse 74   Temp 98 F (36.7 C) (Oral)   Resp 14   Ht 6' (1.829 m)   Wt 103.9 kg (229 lb)   SpO2 99%   BMI 31.06  kg/m   Physical Exam  Constitutional: He is oriented to person, place, and time. He appears well-developed and well-nourished.  HENT:  Head: Normocephalic.  Neck: Normal range of motion. Neck supple.  Cardiovascular: Normal rate and regular rhythm.  Pulmonary/Chest: Effort normal and breath sounds normal. He has no wheezes. He has no rhonchi. He has no rales.  Chest wall tenderness to left parasternal area.   Abdominal: Soft. Bowel sounds are normal. There is no tenderness. There is no rebound and no guarding.  Musculoskeletal: Normal range of motion.       Right lower leg: Normal. He exhibits no  tenderness and no edema.       Left lower leg: Normal. He exhibits no tenderness and no edema.  FROM all extremities. No swelling or discoloration.   Neurological: He is alert and oriented to person, place, and time. He has normal strength and normal reflexes. He displays no tremor. He exhibits normal muscle tone. He displays a negative Romberg sign. Coordination normal. GCS eye subscore is 4. GCS verbal subscore is 5. GCS motor subscore is 6.  Minimal sensory deficit to light touch in left lateral LE, left UE and left face. CN's 3-12 grossly intact.   Skin: Skin is warm and dry. No rash noted.  Psychiatric: He has a normal mood and affect.     ED Treatments / Results  Labs (all labs ordered are listed, but only abnormal results are displayed) Labs Reviewed  BASIC METABOLIC PANEL  CBC  RAPID URINE DRUG SCREEN, HOSP PERFORMED  I-STAT TROPONIN, ED  I-STAT TROPONIN, ED   Results for orders placed or performed during the hospital encounter of 04/05/18  Basic metabolic panel  Result Value Ref Range   Sodium 139 135 - 145 mmol/L   Potassium 3.8 3.5 - 5.1 mmol/L   Chloride 105 101 - 111 mmol/L   CO2 25 22 - 32 mmol/L   Glucose, Bld 90 65 - 99 mg/dL   BUN 15 6 - 20 mg/dL   Creatinine, Ser 1.61 0.61 - 1.24 mg/dL   Calcium 9.1 8.9 - 09.6 mg/dL   GFR calc non Af Amer >60 >60 mL/min   GFR calc Af Amer >60 >60 mL/min   Anion gap 9 5 - 15  CBC  Result Value Ref Range   WBC 6.0 4.0 - 10.5 K/uL   RBC 4.52 4.22 - 5.81 MIL/uL   Hemoglobin 14.3 13.0 - 17.0 g/dL   HCT 04.5 40.9 - 81.1 %   MCV 90.3 78.0 - 100.0 fL   MCH 31.6 26.0 - 34.0 pg   MCHC 35.0 30.0 - 36.0 g/dL   RDW 91.4 78.2 - 95.6 %   Platelets 198 150 - 400 K/uL  Rapid urine drug screen (hospital performed)  Result Value Ref Range   Opiates NONE DETECTED NONE DETECTED   Cocaine NONE DETECTED NONE DETECTED   Benzodiazepines NONE DETECTED NONE DETECTED   Amphetamines NONE DETECTED NONE DETECTED   Tetrahydrocannabinol NONE  DETECTED NONE DETECTED   Barbiturates NONE DETECTED NONE DETECTED  I-stat troponin, ED  Result Value Ref Range   Troponin i, poc 0.00 0.00 - 0.08 ng/mL   Comment 3            EKG None  Radiology Dg Chest 2 View  Result Date: 04/05/2018 CLINICAL DATA:  Central chest pain radiating to LEFT arm for 2 hours, former smoker EXAM: CHEST - 2 VIEW COMPARISON:  None FINDINGS: Normal heart size, mediastinal contours, and pulmonary vascularity.  Lungs clear. No pleural effusion or pneumothorax. Bones unremarkable. IMPRESSION: Normal exam. Electronically Signed   By: Ulyses Southward M.D.   On: 04/05/2018 18:05   Mr Angiogram Head Wo Contrast  Result Date: 04/06/2018 CLINICAL DATA:  Lightheadedness and dizziness for 2 hours. EXAM: MRI HEAD WITHOUT CONTRAST MRA HEAD WITHOUT CONTRAST TECHNIQUE: Multiplanar, multiecho pulse sequences of the brain and surrounding structures were obtained without intravenous contrast. Angiographic images of the head were obtained using MRA technique without contrast. COMPARISON:  None. FINDINGS: MRI HEAD FINDINGS INTRACRANIAL CONTENTS: No reduced diffusion to suggest acute ischemia or hyperacute demyelination. No susceptibility artifact to suggest hemorrhage. The ventricles and sulci are normal for patient's age. No suspicious parenchymal signal, masses, mass effect. No abnormal extra-axial fluid collections. No extra-axial masses. VASCULAR: Normal major intracranial vascular flow voids present at skull base. SKULL AND UPPER CERVICAL SPINE: No abnormal sellar expansion. No suspicious calvarial bone marrow signal. Craniocervical junction maintained. SINUSES/ORBITS: LEFT maxillary mucosal retention cysts without paranasal sinus air-fluid levels. Mastoid air cells are well aerated.The included ocular globes and orbital contents are non-suspicious. OTHER: Prominent cervical lymph nodes are likely reactive. MRA HEAD FINDINGS ANTERIOR CIRCULATION: Normal flow related enhancement of the included  cervical, petrous, cavernous and supraclinoid internal carotid arteries. Patent anterior communicating artery. Patent anterior and middle cerebral arteries, including distal segments. No large vessel occlusion, flow limiting stenosis, aneurysm. POSTERIOR CIRCULATION: LEFT vertebral artery is dominant. Basilar artery is patent, with normal flow related enhancement of the main branch vessels. Patent posterior cerebral arteries. Robust bilateral posterior communicating arteries present. No large vessel occlusion, flow limiting stenosis,  aneurysm. ANATOMIC VARIANTS: None. Source images and MIP images were reviewed. IMPRESSION: Normal noncontrast MRI head and normal noncontrast MRA head. Electronically Signed   By: Awilda Metro M.D.   On: 04/06/2018 01:17   Mr Brain Wo Contrast  Result Date: 04/06/2018 CLINICAL DATA:  Lightheadedness and dizziness for 2 hours. EXAM: MRI HEAD WITHOUT CONTRAST MRA HEAD WITHOUT CONTRAST TECHNIQUE: Multiplanar, multiecho pulse sequences of the brain and surrounding structures were obtained without intravenous contrast. Angiographic images of the head were obtained using MRA technique without contrast. COMPARISON:  None. FINDINGS: MRI HEAD FINDINGS INTRACRANIAL CONTENTS: No reduced diffusion to suggest acute ischemia or hyperacute demyelination. No susceptibility artifact to suggest hemorrhage. The ventricles and sulci are normal for patient's age. No suspicious parenchymal signal, masses, mass effect. No abnormal extra-axial fluid collections. No extra-axial masses. VASCULAR: Normal major intracranial vascular flow voids present at skull base. SKULL AND UPPER CERVICAL SPINE: No abnormal sellar expansion. No suspicious calvarial bone marrow signal. Craniocervical junction maintained. SINUSES/ORBITS: LEFT maxillary mucosal retention cysts without paranasal sinus air-fluid levels. Mastoid air cells are well aerated.The included ocular globes and orbital contents are non-suspicious.  OTHER: Prominent cervical lymph nodes are likely reactive. MRA HEAD FINDINGS ANTERIOR CIRCULATION: Normal flow related enhancement of the included cervical, petrous, cavernous and supraclinoid internal carotid arteries. Patent anterior communicating artery. Patent anterior and middle cerebral arteries, including distal segments. No large vessel occlusion, flow limiting stenosis, aneurysm. POSTERIOR CIRCULATION: LEFT vertebral artery is dominant. Basilar artery is patent, with normal flow related enhancement of the main branch vessels. Patent posterior cerebral arteries. Robust bilateral posterior communicating arteries present. No large vessel occlusion, flow limiting stenosis,  aneurysm. ANATOMIC VARIANTS: None. Source images and MIP images were reviewed. IMPRESSION: Normal noncontrast MRI head and normal noncontrast MRA head. Electronically Signed   By: Awilda Metro M.D.   On: 04/06/2018 01:17    Procedures Procedures (including critical  care time)  Medications Ordered in ED Medications  oxyCODONE-acetaminophen (PERCOCET/ROXICET) 5-325 MG per tablet 1 tablet (1 tablet Oral Given 04/06/18 0131)     Initial Impression / Assessment and Plan / ED Course  I have reviewed the triage vital signs and the nursing notes.  Pertinent labs & imaging results that were available during my care of the patient were reviewed by me and considered in my medical decision making (see chart for details).     Patient  Presents with multiple concerns. Chest pain that started around 2:00 pm and has remained constant, worse with palpation. No SOB, diaphoresis, nausea. Also c/o intermittent headache and left LE sensory deficits without weakness.   Neuro exam in unremarkable and has only minimal sensory deficits to light touch. Chest pain is reproducible to palpation. Labs are normal.   Discussed with neurology who advises MR Brain w/o CM, MRA brain w/o CM to evaluate. Also recommended UDS.  The patient's exam is  unchanged on multiple serial exams. He was given a Percocet for recurrent headache and focal sharp pain in his left leg lateral to knee, which resolved with pain medication. No swelling at any time to the extremity. No calf or thigh tenderness.   Chest pain is felt to be non-cardiac, chest wall pain. Neg troponin. EKG done and reviewed with Dr. Lynelle Doctor and is without acute change. CXR clear. Pain is reproducible. Will treat as chest wall pain, consider costochondritis based on pain/tenderness location.  No evidence of stroke on MR, MRA. Unchanged neurologic exam. Headache resolved. He can be safely discharged and should follow up with neurology outpatient. Referral provided.   Final Clinical Impressions(s) / ED Diagnoses   Final diagnoses:  Chest wall pain  Acute nonintractable headache, unspecified headache type    ED Discharge Orders        Ordered    oxyCODONE-acetaminophen (PERCOCET/ROXICET) 5-325 MG tablet  Every 4 hours PRN     04/06/18 0303       Elpidio Anis, PA-C 04/06/18 6962    Tegeler, Canary Brim, MD 04/06/18 [id number]

## 2018-04-05 NOTE — ED Triage Notes (Signed)
Pt endorses left side chest pain x 2 hours with lightheadedness and dizziness with left arm radiation while getting a hair cut. No medical hx. VSS.

## 2018-04-06 ENCOUNTER — Emergency Department (HOSPITAL_COMMUNITY): Payer: BLUE CROSS/BLUE SHIELD

## 2018-04-06 LAB — RAPID URINE DRUG SCREEN, HOSP PERFORMED
AMPHETAMINES: NOT DETECTED
BARBITURATES: NOT DETECTED
Benzodiazepines: NOT DETECTED
Cocaine: NOT DETECTED
Opiates: NOT DETECTED
Tetrahydrocannabinol: NOT DETECTED

## 2018-04-06 LAB — I-STAT TROPONIN, ED: TROPONIN I, POC: 0 ng/mL (ref 0.00–0.08)

## 2018-04-06 MED ORDER — OXYCODONE-ACETAMINOPHEN 5-325 MG PO TABS
1.0000 | ORAL_TABLET | Freq: Once | ORAL | Status: AC
  Administered 2018-04-06: 1 via ORAL
  Filled 2018-04-06: qty 1

## 2018-04-06 MED ORDER — OXYCODONE-ACETAMINOPHEN 5-325 MG PO TABS: 1.0000 | ORAL_TABLET | ORAL | 0 refills | Status: DC | PRN

## 2018-04-06 NOTE — ED Provider Notes (Signed)
ED ECG REPORT   Date: 04/06/2018  Rate: 64  Rhythm: normal sinus rhythm  QRS Axis: normal  Intervals: normal  ST/T Wave abnormalities: normal  Conduction Disutrbances:none  Narrative Interpretation:   Old EKG Reviewed: none available  I have personally reviewed the EKG tracing and agree with the computerized printout as noted.  Devoria Albe, MD, Concha Pyo, MD 04/06/18 [phone]

## 2018-04-06 NOTE — Discharge Instructions (Addendum)
Follow up with your primary care provider and schedule an appointment with neurology for further evaluation of headache. Return here with any worsening symptoms or new concern.

## 2018-04-06 NOTE — ED Notes (Signed)
Pt unable to give urine sample at this time 

## 2018-04-06 NOTE — ED Notes (Signed)
Patient transported to MRI 

## 2018-04-06 NOTE — ED Notes (Signed)
Notified nurse about trop I pending since 2214,  Nurse advised pt is in MRI and will collect when pt returns.

## 2018-05-09 ENCOUNTER — Ambulatory Visit: Payer: BLUE CROSS/BLUE SHIELD | Admitting: Sports Medicine

## 2018-05-09 ENCOUNTER — Encounter: Payer: Self-pay | Admitting: Sports Medicine

## 2018-05-09 VITALS — BP 120/80 | HR 93 | Ht 72.0 in | Wt 233.2 lb

## 2018-05-09 DIAGNOSIS — M9901 Segmental and somatic dysfunction of cervical region: Secondary | ICD-10-CM | POA: Diagnosis not present

## 2018-05-09 DIAGNOSIS — M549 Dorsalgia, unspecified: Secondary | ICD-10-CM

## 2018-05-09 DIAGNOSIS — M9902 Segmental and somatic dysfunction of thoracic region: Secondary | ICD-10-CM

## 2018-05-09 DIAGNOSIS — M9908 Segmental and somatic dysfunction of rib cage: Secondary | ICD-10-CM

## 2018-05-09 MED ORDER — IBUPROFEN-FAMOTIDINE 800-26.6 MG PO TABS: 1.0000 | ORAL_TABLET | Freq: Three times a day (TID) | ORAL | 0 refills | Status: AC | PRN

## 2018-05-09 NOTE — Progress Notes (Signed)
Devin FellsMichael D. Delorise Shinerigby, DO  [facility] [place] [phone]  Devin Hoffman Izzo - 37 y.o. male MRN [id number]  Date of birth: Nov 13, 1981  Visit Date: 05/09/2018  PCP: System, Pcp Not In   Referred by: No ref. provider found  Scribe(s) for today's visit: Stevenson ClinchBrandy Coleman, CMA  SUBJECTIVE:  Devin Hoffman Califf is here for Initial Assessment (back pain)   His neck and upper back pain symptoms INITIALLY: Began about 3-4 days ago and he first noticed stiffness after a bad nights sleep.  Described as moderate tightness/stiffness, radiating to L shoulder, near scapula.  Worsened when turning head to the L - feels like ROM has decreased. He has some tenderness to palpation.  Improved with stretching Additional associated symptoms include: He denies n/t in the arms or fingers, dizziness, visual changes. He has headaches but nothing out of the norm for him.     At this time symptoms show no change compared to onset. He has been taking Tylenol with minimal relief. He has tried using SolonPas with minimal relief.   No recent XR of c-spine or t-spine.  Had XR t-spine 09/08/14.   REVIEW OF SYSTEMS: Reports night time disturbances. Denies fevers, chills, or night sweats. Denies unexplained weight loss. Denies personal history of cancer. Denies changes in bowel or bladder habits. Reports recent unreported falls - about 2 months ago, tripped and fell backwards. Denies new or worsening dyspnea or wheezing. Reports headaches.  Denies numbness, tingling or weakness  In the extremities.  Denies dizziness or presyncopal episodes Denies lower extremity edema    HISTORY & PERTINENT PRIOR DATA:  Prior History reviewed and updated per electronic medical record.  Significant/pertinent history, findings, studies include:  reports that he quit smoking about 5 years ago. His smoking use included cigarettes. He has never used smokeless tobacco. No results for  input(s): HGBA1C, LABURIC, CREATINE in the last 8760 hours. No specialty comments available. No problems updated.  OBJECTIVE:  VS:  HT:6' (182.9 cm)   WT:233 lb 3.2 oz (105.8 kg)  BMI:31.62    BP:120/80  HR:93bpm  TEMP: ( )  RESP:96 %   PHYSICAL EXAM: Constitutional: WDWN, Non-toxic appearing. Psychiatric: Alert & appropriately interactive.  Not depressed or anxious appearing. Respiratory: No increased work of breathing.  Trachea Midline Eyes: Pupils are equal.  EOM intact without nystagmus.  No scleral icterus  Vascular Exam: warm to touch no edema  upper extremity neuro exam: unremarkable normal strength normal sensation normal reflexes  MSK Exam: Neck is overall well aligned.  Slight anterior head tilt.  Protracted shoulders.  Thoracic kyphosis is mild to moderate but is mobile.  He has negative neural tension signs in his upper extremities.   ASSESSMENT & PLAN:   1. Upper back pain   2. Somatic dysfunction of cervical region   3. Somatic dysfunction of thoracic region   4. Somatic dysfunction of rib cage region     PLAN: Osteopathic manipulation was performed today based on physical exam findings.  Please see procedure note for further information including Osteopathic Exam findings  Short course of anti-inflammatories for intermittent back pain but discussed that the foundation of treatment for this is therapeutic exercise and intermittent manipulation if needed.  He will continue with his home program and follow-up as needed.   Follow-up: Return if symptoms worsen or fail to improve.      Please see additional documentation for Objective, Assessment and Plan sections. Pertinent additional documentation may be included in corresponding  procedure notes, imaging studies, problem based documentation and patient instructions. Please see these sections of the encounter for additional information regarding this visit.  CMA/ATC served as Neurosurgeon during this visit.  History, Physical, and Plan performed by medical provider. Documentation and orders reviewed and attested to.      Andrena Mews, DO    [facility]

## 2018-05-09 NOTE — Progress Notes (Signed)
PROCEDURE NOTE : OSTEOPATHIC MANIPULATION The decision today to treat with Osteopathic Manipulative Therapy (OMT) was based on physical exam findings. Verbal consent was obtained following a discussion with the patient regarding the of risks, benefits and potential side effects, including an acute pain flare,post manipulation soreness and need for repeat treatments. Additionally, we specifically discussed the minimal risk of  injury to neurovascular structures associated with Cervical manipulation.   Contraindications to OMT reviewed and include: NONE  Manipulation was performed as below: Regions treated: Cervical spine, Ribs and Thoracic spine OMT Techniques Used: HVLA, muscle energy and myofascial release  The patient tolerated the treatment well and reported Improved symptoms following treatment today. Patient was given medications, exercises, stretches and lifestyle modifications per AVS and verbally.   OSTEOPATHIC/STRUCTURAL EXAM:  C2 through C4 FRS left C6 FRS right T2 - T5 neutral side bent right, rotated left Ribs 6 posterior right

## 2018-06-10 ENCOUNTER — Encounter: Payer: Self-pay | Admitting: Sports Medicine

## 2018-06-11 NOTE — Addendum Note (Signed)
Addended by: Gaspar BiddingIGBY, Malini Flemings D on: 06/11/2018 05:16 PM   Modules accepted: Level of Service

## 2019-08-03 DIAGNOSIS — J020 Streptococcal pharyngitis: Secondary | ICD-10-CM | POA: Diagnosis not present

## 2019-09-17 DIAGNOSIS — R0789 Other chest pain: Secondary | ICD-10-CM | POA: Diagnosis not present

## 2019-09-23 DIAGNOSIS — E739 Lactose intolerance, unspecified: Secondary | ICD-10-CM | POA: Diagnosis not present

## 2019-09-23 DIAGNOSIS — Z23 Encounter for immunization: Secondary | ICD-10-CM | POA: Diagnosis not present

## 2019-09-23 DIAGNOSIS — Z0000 Encounter for general adult medical examination without abnormal findings: Secondary | ICD-10-CM | POA: Diagnosis not present

## 2019-09-23 DIAGNOSIS — F419 Anxiety disorder, unspecified: Secondary | ICD-10-CM | POA: Diagnosis not present

## 2019-09-23 DIAGNOSIS — E669 Obesity, unspecified: Secondary | ICD-10-CM | POA: Diagnosis not present

## 2019-09-23 DIAGNOSIS — E559 Vitamin D deficiency, unspecified: Secondary | ICD-10-CM | POA: Diagnosis not present

## 2019-10-21 DIAGNOSIS — R079 Chest pain, unspecified: Secondary | ICD-10-CM | POA: Diagnosis not present

## 2019-10-30 DIAGNOSIS — Z20828 Contact with and (suspected) exposure to other viral communicable diseases: Secondary | ICD-10-CM | POA: Diagnosis not present

## 2019-11-06 DIAGNOSIS — R079 Chest pain, unspecified: Secondary | ICD-10-CM | POA: Diagnosis not present

## 2019-11-10 DIAGNOSIS — R079 Chest pain, unspecified: Secondary | ICD-10-CM | POA: Diagnosis not present

## 2020-03-04 DIAGNOSIS — Z209 Contact with and (suspected) exposure to unspecified communicable disease: Secondary | ICD-10-CM | POA: Diagnosis not present

## 2020-03-04 DIAGNOSIS — W460XXA Contact with hypodermic needle, initial encounter: Secondary | ICD-10-CM | POA: Diagnosis not present

## 2020-04-14 ENCOUNTER — Other Ambulatory Visit: Payer: Self-pay

## 2020-04-14 ENCOUNTER — Encounter: Payer: Self-pay | Admitting: Family Medicine

## 2020-04-14 ENCOUNTER — Ambulatory Visit: Payer: BC Managed Care – PPO | Admitting: Family Medicine

## 2020-04-14 VITALS — BP 136/82 | HR 100 | Ht 72.0 in | Wt 233.0 lb

## 2020-04-14 DIAGNOSIS — G8929 Other chronic pain: Secondary | ICD-10-CM | POA: Diagnosis not present

## 2020-04-14 DIAGNOSIS — M25561 Pain in right knee: Secondary | ICD-10-CM | POA: Insufficient documentation

## 2020-04-14 DIAGNOSIS — M999 Biomechanical lesion, unspecified: Secondary | ICD-10-CM | POA: Diagnosis not present

## 2020-04-14 DIAGNOSIS — M545 Low back pain: Secondary | ICD-10-CM

## 2020-04-14 DIAGNOSIS — M5416 Radiculopathy, lumbar region: Secondary | ICD-10-CM | POA: Diagnosis not present

## 2020-04-14 MED ORDER — MELOXICAM 15 MG PO TABS: 15.0000 mg | ORAL_TABLET | Freq: Every day | ORAL | 0 refills | Status: DC

## 2020-04-14 MED ORDER — GABAPENTIN 100 MG PO CAPS: 200.0000 mg | ORAL_CAPSULE | Freq: Every day | ORAL | 3 refills | Status: DC

## 2020-04-14 NOTE — Progress Notes (Signed)
[facility] [address] [place] Phone: [phone] Subjective:   I Devin Hoffman am serving as a Education administrator for Dr. Hulan Hoffman.  This visit occurred during the SARS-CoV-2 public health emergency.  Safety protocols were in place, including screening questions prior to the visit, additional usage of staff PPE, and extensive cleaning of exam room while observing appropriate contact time as indicated for disinfecting solutions.   I'm seeing this patient by the request  of:  System, Pcp Not In  CC:   [id number]  Devin Hoffman is a 39 y.o. male coming in with complaint of back and bilateral knee pain. Pain radiates down the left leg. Had surgery about 15 years ago on the right knee. Medial meniscus removed and states ever since he has had issues. Compensation has caused the left knee to hurt.   Onset- Back pain 2 weeks; Knee pain is chronic  Location - lower back left Character- back pain is a constant throbbing Aggravating factors- standing for long periods of time  Reliving factors-  Therapies tried- icy hot patches, biofreeze, steroid injections for the knee  Severity-5-6 out of 10     No past medical history on file. Past Surgical History:  Procedure Laterality Date  . HAND SURGERY Left   . KNEE SURGERY     Social History   Socioeconomic History  . Marital status: Married    Spouse name: Not on file  . Number of children: Not on file  . Years of education: Not on file  . Highest education level: Not on file  Occupational History  . Not on file  Tobacco Use  . Smoking status: Former Smoker    Types: Cigarettes    Quit date: 04/05/2013    Years since quitting: 7.0  . Smokeless tobacco: Never Used  Substance and Sexual Activity  . Alcohol use: No  . Drug use: No  . Sexual activity: Yes    Partners: Female    Birth control/protection: None  Other Topics Concern  . Not on file  Social History Narrative  . Not on  file   Social Determinants of Health   Financial Resource Strain:   . Difficulty of Paying Living Expenses:   Food Insecurity:   . Worried About Charity fundraiser in the Last Year:   . Arboriculturist in the Last Year:   Transportation Needs:   . Film/video editor (Medical):   Marland Kitchen Lack of Transportation (Non-Medical):   Physical Activity:   . Days of Exercise per Week:   . Minutes of Exercise per Session:   Stress:   . Feeling of Stress :   Social Connections:   . Frequency of Communication with Friends and Family:   . Frequency of Social Gatherings with Friends and Family:   . Attends Religious Services:   . Active Member of Clubs or Organizations:   . Attends Archivist Meetings:   Marland Kitchen Marital Status:    No Known Allergies Family History  Problem Relation Age of Onset  . Alcohol abuse Father   . Autism spectrum disorder Brother        Current Outpatient Medications (Analgesics):  .  meloxicam (MOBIC) 15 MG tablet, Take 1 tablet (15 mg total) by mouth daily.   Current Outpatient Medications (Other):  Marland Kitchen  Cholecalciferol (VITAMIN D3) 50000 units CAPS, Take by mouth. .  Multiple Vitamin (MULTIVITAMIN) capsule, Take by mouth. .  gabapentin (NEURONTIN) 100 MG  capsule, Take 2 capsules (200 mg total) by mouth at bedtime.   Reviewed prior external information including notes and imaging from  primary care provider As well as notes that were available from care everywhere and other healthcare systems.  Past medical history, social, surgical and family history all reviewed in electronic medical record.  No pertanent information unless stated regarding to the chief complaint.   Review of Systems:  No headache, visual changes, nausea, vomiting, diarrhea, constipation, dizziness, abdominal pain, skin rash, fevers, chills, night sweats, weight loss, swollen lymph nodes, body aches, joint swelling, chest pain, shortness of breath, mood changes. POSITIVE muscle  aches  Objective  Blood pressure 136/82, pulse 100, height 6' (1.829 m), weight 233 lb (105.7 kg), SpO2 98 %.   General: No apparent distress alert and oriented x3 mood and affect normal, dressed appropriately.  HEENT: Pupils equal, extraocular movements intact  Respiratory: Patient's speak in full sentences and does not appear short of breath  Cardiovascular: No lower extremity edema, non tender, no erythema  Neuro: Cranial nerves II through XII are intact, neurovascularly intact in all extremities with 2+ DTRs and 2+ pulses.  Gait normal with good balance and coordination.  MSK:  Non tender with full range of motion and good stability and symmetric strength and tone of shoulders, elbows, wrist, hipand ankles bilaterally.  Right knee exam has full range of motion.  Mild tenderness over the medial joint space.  No significant instability noted though.  Patient has full range of motion.  Mild crepitus noted of the patellofemoral joint.  Back exam loss of lordosis.  No true radicular symptoms with straight leg test but does have significant tightness of the left hamstring compared to the right.  Tightness with FABER test as well.  Deep tendon reflexes intact.  Neurovascularly intact distally  Osteopathic findings  T6 extended rotated and side bent left L1 flexed rotated and side bent right Sacrum right on right    Impression and Recommendations:     This case required medical decision making of moderate complexity. The above documentation has been reviewed and is accurate and complete Devin Saa, DO       Note: This dictation was prepared with Dragon dictation along with smaller phrase technology. Any transcriptional errors that result from this process are unintentional.

## 2020-04-14 NOTE — Patient Instructions (Addendum)
Good to see you Xrays at [facility] location Meloxicam 10 days then as needed Gabapentin 200 mg at night Exercise 3 times a week See me again in 4-5 weeks

## 2020-04-14 NOTE — Assessment & Plan Note (Signed)
Lumbar radiculopathy.  Discussed icing regimen and home exercise, which activities to do which wants to avoid.  Increase activity slowly.  Patient to meloxicam and gabapentin.  Warned potential side effects.  X-rays pending.  Follow-up again in 4 to 6 weeks.  Responded well to manipulation.  Could consider adding physical therapy if necessary

## 2020-04-14 NOTE — Assessment & Plan Note (Signed)

## 2020-04-14 NOTE — Assessment & Plan Note (Signed)
Right knee pain.  Past medical history significant for meniscal removal.  Could have some mild underlying arthritic changes.  Mild exercises given.  Do not believe bracing is necessary.  Meloxicam given and home exercises.  Follow-up again in 4 to 8 weeks.

## 2020-04-21 ENCOUNTER — Ambulatory Visit (INDEPENDENT_AMBULATORY_CARE_PROVIDER_SITE_OTHER): Payer: BC Managed Care – PPO

## 2020-04-21 DIAGNOSIS — M25561 Pain in right knee: Secondary | ICD-10-CM

## 2020-04-21 DIAGNOSIS — G8929 Other chronic pain: Secondary | ICD-10-CM | POA: Diagnosis not present

## 2020-04-21 DIAGNOSIS — M545 Low back pain: Secondary | ICD-10-CM

## 2020-04-30 DIAGNOSIS — J1083 Influenza due to other identified influenza virus with otitis media: Secondary | ICD-10-CM | POA: Diagnosis not present

## 2020-04-30 DIAGNOSIS — H6691 Otitis media, unspecified, right ear: Secondary | ICD-10-CM | POA: Diagnosis not present

## 2020-04-30 DIAGNOSIS — Z20822 Contact with and (suspected) exposure to covid-19: Secondary | ICD-10-CM | POA: Diagnosis not present

## 2020-05-12 ENCOUNTER — Ambulatory Visit: Payer: BC Managed Care – PPO | Admitting: Family Medicine

## 2020-05-12 NOTE — Progress Notes (Deleted)
Devin [facility] [address] [place] Phone: [phone] Subjective:    I'm seeing this patient by the request  of:  System, Pcp Not In  CC:   [id number]  Devin Hoffman is a 39 y.o. male coming in with complaint of ***  Onset-  Location Duration-  Character- Aggravating factors- Reliving factors-  Therapies tried-  Severity-     No past medical history on file. Past Surgical History:  Procedure Laterality Date   HAND SURGERY Left    KNEE SURGERY     Social History   Socioeconomic History   Marital status: Married    Spouse name: Not on file   Number of children: Not on file   Years of education: Not on file   Highest education level: Not on file  Occupational History   Not on file  Tobacco Use   Smoking status: Former Smoker    Types: Cigarettes    Quit date: 04/05/2013    Years since quitting: 7.1   Smokeless tobacco: Never Used  Substance and Sexual Activity   Alcohol use: No   Drug use: No   Sexual activity: Yes    Partners: Female    Birth control/protection: None  Other Topics Concern   Not on file  Social History Narrative   Not on file   Social Determinants of Health   Financial Resource Strain:    Difficulty of Paying Living Expenses:   Food Insecurity:    Worried About Charity fundraiser in the Last Year:    Arboriculturist in the Last Year:   Transportation Needs:    Film/video editor (Medical):    Lack of Transportation (Non-Medical):   Physical Activity:    Days of Exercise per Week:    Minutes of Exercise per Session:   Stress:    Feeling of Stress :   Social Connections:    Frequency of Communication with Friends and Family:    Frequency of Social Gatherings with Friends and Family:    Attends Religious Services:    Active Member of Clubs or Organizations:    Attends Music therapist:    Marital Status:    No Known  Allergies Family History  Problem Relation Age of Onset   Alcohol abuse Father    Autism spectrum disorder Brother        Current Outpatient Medications (Analgesics):    meloxicam (MOBIC) 15 MG tablet, Take 1 tablet (15 mg total) by mouth daily.   Current Outpatient Medications (Other):    Cholecalciferol (VITAMIN D3) 50000 units CAPS, Take by mouth.   gabapentin (NEURONTIN) 100 MG capsule, Take 2 capsules (200 mg total) by mouth at bedtime.   Multiple Vitamin (MULTIVITAMIN) capsule, Take by mouth.   Reviewed prior external information including notes and imaging from  primary care provider As well as notes that were available from care everywhere and other healthcare systems.  Past medical history, social, surgical and family history all reviewed in electronic medical record.  No pertanent information unless stated regarding to the chief complaint.   Review of Systems:  No headache, visual changes, nausea, vomiting, diarrhea, constipation, dizziness, abdominal pain, skin rash, fevers, chills, night sweats, weight loss, swollen lymph nodes, body aches, joint swelling, chest pain, shortness of breath, mood changes. POSITIVE muscle aches  Objective  There were no vitals taken for this visit.   General: No apparent distress alert and oriented x3 mood and affect  normal, dressed appropriately.  HEENT: Pupils equal, extraocular movements intact  Respiratory: Patient's speak in full sentences and does not appear short of breath  Cardiovascular: No lower extremity edema, non tender, no erythema  Neuro: Cranial nerves II through XII are intact, neurovascularly intact in all extremities with 2+ DTRs and 2+ pulses.  Gait normal with good balance and coordination.  MSK:  Non tender with full range of motion and good stability and symmetric strength and tone of shoulders, elbows, wrist, hip, knee and ankles bilaterally.    Osteopathic findings Cervical C2 flexed rotated and side  bent right C4 flexed rotated and side bent left C6 flexed rotated and side bent left T3 extended rotated and side bent right inhaled third rib T9 extended rotated and side bent left L2 flexed rotated and side bent right Sacrum right on right    Impression and Recommendations:     The above documentation has been reviewed and is accurate and complete Judi Saa, DO       Note: This dictation was prepared with Dragon dictation along with smaller phrase technology. Any transcriptional errors that result from this process are unintentional.

## 2020-05-25 ENCOUNTER — Ambulatory Visit: Payer: Self-pay | Admitting: Family Medicine

## 2020-05-26 ENCOUNTER — Ambulatory Visit: Payer: BC Managed Care – PPO | Admitting: Family Medicine

## 2020-05-26 ENCOUNTER — Ambulatory Visit: Payer: Self-pay

## 2020-05-26 ENCOUNTER — Other Ambulatory Visit: Payer: Self-pay

## 2020-05-26 DIAGNOSIS — M25561 Pain in right knee: Secondary | ICD-10-CM | POA: Diagnosis not present

## 2020-05-26 DIAGNOSIS — G8929 Other chronic pain: Secondary | ICD-10-CM | POA: Diagnosis not present

## 2020-05-26 DIAGNOSIS — M25562 Pain in left knee: Secondary | ICD-10-CM | POA: Diagnosis not present

## 2020-05-26 NOTE — Progress Notes (Signed)
I, Devin Hoffman, LAT, ATC, am serving as scribe for Dr. [name].  Devin Hoffman is a 39 y.o. male who presents to [facility] today for f/u of B knee pain.  He was last seen by Dr. Katrinka Hoffman on 04/14/20 for low back pain and B knee pain and was prescribed Meloxicam.  Pt has a hx of a prior R knee menisectomy about 15 years ago and has a prior knee injection.  Since his last visit w/ Dr. Katrinka Hoffman, pt reports his right knee has worsened.  He recently moved and had more pain as a result.  His left knee is bothering him also he thinks because of compensatory gait.  Diagnostic imaging: R knee XR- 04/21/20   Pertinent review of systems: No fevers or chills  Relevant historical information: History meniscectomy right knee   Exam:   General: Well Developed, well nourished, and in no acute distress.   MSK: Right knee normal-appearing without effusion. Normal motion with crepitation.  Tender palpation medial joint line.  Left knee normal-appearing Normal motion with crepitation. Mildly tender palpation medial joint line.    Lab and Radiology Results  DG Lumbar Spine Complete  Result Date: 04/21/2020 CLINICAL DATA:  Low back pain for several weeks, no known injury, initial encounter EXAM: LUMBAR SPINE - COMPLETE 4+ VIEW COMPARISON:  None. FINDINGS: Five lumbar type vertebral bodies are well visualized. Vertebral body height is well maintained. No pars defects are seen. No disc pathology is noted. IMPRESSION: No acute abnormality noted. Electronically Signed   By: [name]   On: 04/21/2020 12:38   DG Knee Complete 4 Views Right  Result Date: 04/21/2020 CLINICAL DATA:  Right knee pain EXAM: RIGHT KNEE - COMPLETE 4+ VIEW COMPARISON:  None. FINDINGS: No evidence of fracture, dislocation, or joint effusion. No evidence of arthropathy or other focal bone abnormality. Soft tissues are unremarkable. IMPRESSION: No acute abnormality noted. Electronically Signed    By: [name]   On: 04/21/2020 12:39   I, [name], personally (independently) visualized and performed the interpretation of the images attached in this note.  Procedure: Real-time Ultrasound Guided Injection of right knee lateral superior patellar space. Device: Philips Affiniti 50G Images permanently stored and available for review in the ultrasound unit. Verbal informed consent obtained.  Discussed risks and benefits of procedure. Warned about infection bleeding damage to structures skin hypopigmentation and fat atrophy among others. Patient expresses understanding and agreement Time-out conducted.   Noted no overlying erythema, induration, or other signs of local infection.   Skin prepped in a sterile fashion.   Local anesthesia: Topical Ethyl chloride.   With sterile technique and under real time ultrasound guidance:  40 mg of Kenalog and 2 mL of Marcaine injected easily.   Completed without difficulty   Pain immediately resolved suggesting accurate placement of the medication.   Advised to call if fevers/chills, erythema, induration, drainage, or persistent bleeding.   Images permanently stored and available for review in the ultrasound unit.  Impression: Technically successful ultrasound guided injection.     Procedure: Real-time Ultrasound Guided Injection of left knee lateral superior patellar space Device: Philips Affiniti 50G Images permanently stored and available for review in the ultrasound unit. Verbal informed consent obtained.  Discussed risks and benefits of procedure. Warned about infection bleeding damage to structures skin hypopigmentation and fat atrophy among others. Patient expresses understanding and agreement Time-out conducted.   Noted no overlying erythema, induration, or other signs of  local infection.   Skin prepped in a sterile fashion.   Local anesthesia: Topical Ethyl chloride.   With sterile technique and under real time ultrasound  guidance:  40 mg of Kenalog and 2 mL of Marcaine injected easily.   Completed without difficulty   Pain immediately resolved suggesting accurate placement of the medication.   Advised to call if fevers/chills, erythema, induration, drainage, or persistent bleeding.   Images permanently stored and available for review in the ultrasound unit.  Impression: Technically successful ultrasound guided injection.         Assessment and Plan: 39 y.o. male with bilateral knee pain right worse than left.  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  Plan for bilateral injection.  Also recommend Voltaren gel and quad strengthening.  Weight loss will also be helpful. Recheck back as needed.  Provided information about hyaluronic acid injections.  We will start authorization of these if not improving or if worsening.    Orders Placed This Encounter  Procedures  . [place] LIMITED JOINT SPACE STRUCTURES LOW BILAT(NO LINKED CHARGES)    Order Specific Question:   Reason for Exam (SYMPTOM  OR DIAGNOSIS REQUIRED)    Answer:   eval knee pain    Order Specific Question:   Preferred imaging location?    Answer:   [facility]   No orders of the defined types were placed in this encounter.    Discussed warning signs or symptoms. Please see discharge instructions. Patient expresses understanding.   The above documentation has been reviewed and is accurate and complete [name], M.D.

## 2020-05-26 NOTE — Patient Instructions (Signed)
Thank you for coming in today. Call or go to the ER if you develop a large red swollen joint with extreme pain or oozing puss.  Ok to use meloxicam as needed.  Try voltaren gel over the counter up to 4x daily.  Work quad strength and weight loss.  If these shots dont last we can do gel shots   Sodium Hyaluronate intra-articular injection What is this medicine? SODIUM HYALURONATE (SOE dee um hye al yoor ON ate) is used to treat pain in the knee due to osteoarthritis. This medicine may be used for other purposes; ask your health care provider or pharmacist if you have questions. COMMON BRAND NAME(S): Amvisc, DUROLANE, Euflexxa, GELSYN-3, Hyalgan, Hymovis, Monovisc, Orthovisc, Supartz, Supartz FX, TriVisc, VISCO What should I tell my health care provider before I take this medicine? They need to know if you have any of these conditions:  bleeding disorders  glaucoma  infection in the knee joint  skin conditions or sensitivity  skin infection  an unusual allergic reaction to sodium hyaluronate, other medicines, foods, dyes, or preservatives. Different brands of sodium hyaluronate contain different allergens. Some may contain egg. Talk to your doctor about your allergies to make sure that you get the right product.  pregnant or trying to get pregnant  breast-feeding How should I use this medicine? This medicine is for injection into the knee joint. It is given by a health care professional in a hospital or clinic setting. Talk to your pediatrician regarding the use of this medicine in children. Special care may be needed. Overdosage: If you think you have taken too much of this medicine contact a poison control center or emergency room at once. NOTE: This medicine is only for you. Do not share this medicine with others. What if I miss a dose? This does not apply. What may interact with this medicine? Interactions are not expected. This list may not describe all possible  interactions. Give your health care provider a list of all the medicines, herbs, non-prescription drugs, or dietary supplements you use. Also tell them if you smoke, drink alcohol, or use illegal drugs. Some items may interact with your medicine. What should I watch for while using this medicine? Tell your doctor or healthcare professional if your symptoms do not start to get better or if they get worse. If receiving this medicine for osteoarthritis, limit your activity after you receive your injection. Avoid physical activity for 48 hours following your injection to keep your knee from swelling. Do not stand on your feet for more than 1 hour at a time during the first 48 hours following your injection. Ask your doctor or healthcare professional about when you can begin major physical activity again. What side effects may I notice from receiving this medicine? Side effects that you should report to your doctor or health care professional as soon as possible:  allergic reactions like skin rash, itching or hives, swelling of the face, lips, or tongue  dizziness  facial flushing  pain, tingling, numbness in the hands or feet  vision changes if received this medicine during eye surgery Side effects that usually do not require medical attention (report to your doctor or health care professional if they continue or are bothersome):  back pain  bruising at site where injected  chills  diarrhea  fever  headache  joint pain  joint stiffness  joint swelling  muscle cramps  muscle pain  nausea, vomiting  pain, redness, or irritation at site where injected  weak or tired This list may not describe all possible side effects. Call your doctor for medical advice about side effects. You may report side effects to FDA at 1-800-FDA-1088. Where should I keep my medicine? This drug is given in a hospital or clinic and will not be stored at home. NOTE: This sheet is a summary. It may not  cover all possible information. If you have questions about this medicine, talk to your doctor, pharmacist, or health care provider.  2020 Elsevier/Gold Standard (2015-12-15 08:34:51)

## 2020-06-20 ENCOUNTER — Ambulatory Visit (INDEPENDENT_AMBULATORY_CARE_PROVIDER_SITE_OTHER): Payer: BC Managed Care – PPO | Admitting: Family Medicine

## 2020-06-20 ENCOUNTER — Other Ambulatory Visit: Payer: Self-pay

## 2020-06-20 ENCOUNTER — Encounter: Payer: Self-pay | Admitting: Family Medicine

## 2020-06-20 VITALS — BP 128/80 | HR 76 | Ht 72.0 in | Wt 233.0 lb

## 2020-06-20 DIAGNOSIS — M5416 Radiculopathy, lumbar region: Secondary | ICD-10-CM

## 2020-06-20 DIAGNOSIS — G8929 Other chronic pain: Secondary | ICD-10-CM | POA: Diagnosis not present

## 2020-06-20 DIAGNOSIS — M999 Biomechanical lesion, unspecified: Secondary | ICD-10-CM | POA: Diagnosis not present

## 2020-06-20 DIAGNOSIS — M25561 Pain in right knee: Secondary | ICD-10-CM

## 2020-06-20 NOTE — Progress Notes (Signed)
[name] [address] [place] [address] Phone: [phone] Subjective:   I Devin Hoffman am serving as a Neurosurgeon for Dr. Antoine Primas.  This visit occurred during the SARS-CoV-2 public health emergency.  Safety protocols were in place, including screening questions prior to the visit, additional usage of staff PPE, and extensive cleaning of exam room while observing appropriate contact time as indicated for disinfecting solutions.   I'm seeing this patient by the request  of:  System, Pcp Not In  CC: back pain follow up   [id number]  Devin Hoffman is a 38 y.o. male coming in with complaint of back and neck pain. Right knee injection 05/26/2020 from Dr. Denyse Amass. Last seen 04/14/2020 for OMT. Patient states his back is doing better. Knee is still painful. Left knee is much better not 100%. Right knee is still painful.   Medications patient has been prescribed: gabapentin, meloxicam           Reviewed prior external information including notes and imaging from previsou exam, outside providers and external EMR if available.   As well as notes that were available from care everywhere and other healthcare systems.  Past medical history, social, surgical and family history all reviewed in electronic medical record.  No pertanent information unless stated regarding to the chief complaint.   No past medical history on file.  No Known Allergies   Review of Systems:  No headache, visual changes, nausea, vomiting, diarrhea, constipation, dizziness, abdominal pain, skin rash, fevers, chills, night sweats, weight loss, swollen lymph nodes, body aches, joint swelling, chest pain, shortness of breath, mood changes. POSITIVE muscle aches  Objective  Blood pressure 128/80, pulse 76, height 6' (1.829 m), weight (!) 233 lb (105.7 kg), SpO2 98 %.   General: No apparent distress alert and oriented x3 mood and affect normal, dressed appropriately.    HEENT: Pupils equal, extraocular movements intact  Respiratory: Patient's speak in full sentences and does not appear short of breath  Cardiovascular: No lower extremity edema, non tender, no erythema  Neuro: Cranial nerves II through XII are intact, neurovascularly intact in all extremities with 2+ DTRs and 2+ pulses.  Gait normal with good balance and coordination.  MSK:  Non tender with full range of motion and good stability and symmetric strength and tone of shoulders, elbows, wrist, hip, and ankles bilaterally.  Back - low back pain TTP soft tissue but good ROM, mild loss of lordosis   Right knee + grind test noted, no instability   Osteopathic findings  C2 flexed rotated and side bent right T3 extended rotated and side bent right inhaled rib T5 extended rotated and side bent left L2 flexed rotated and side bent right Sacrum right on right       Assessment and Plan:  Lumbar radiculopathy Back pain, chronic stable, respond well to OMT, discussed meloxicam and gabapentin  RTC in 4-6 weeks   Right knee pain Responded well to injection by other provider, will get visco approved. RTC in 4 weeks      Nonallopathic problems  Decision today to treat with OMT was based on Physical Exam  After verbal consent patient was treated with HVLA, ME, FPR techniques in  thoracic, lumbar, and sacral  areas  Patient tolerated the procedure well with improvement in symptoms  Patient given exercises, stretches and lifestyle modifications  See medications in patient instructions if given  Patient will follow up in 4-8 weeks  The above documentation has been reviewed and is accurate and complete Lyndal Pulley, DO       Note: This dictation was prepared with Dragon dictation along with smaller phrase technology. Any transcriptional errors that result from this process are unintentional.

## 2020-06-20 NOTE — Patient Instructions (Signed)
Will get approval for gel injection for knee See me for the knee 4 weeks  Use the standing desk with virtical mouse with monitor at eye level See me again in for you back in 1 month

## 2020-06-21 ENCOUNTER — Encounter: Payer: Self-pay | Admitting: Family Medicine

## 2020-06-21 NOTE — Assessment & Plan Note (Signed)
Back pain, chronic stable, respond well to OMT, discussed meloxicam and gabapentin  RTC in 4-6 weeks

## 2020-06-21 NOTE — Assessment & Plan Note (Signed)
Responded well to injection by other provider, will get [employer] approved. RTC in 4 weeks

## 2020-06-27 DIAGNOSIS — Z20822 Contact with and (suspected) exposure to covid-19: Secondary | ICD-10-CM | POA: Diagnosis not present

## 2020-07-11 ENCOUNTER — Telehealth: Payer: Self-pay | Admitting: Family Medicine

## 2020-07-11 NOTE — Telephone Encounter (Signed)
Judeth Cornfield from [employer] called [phone] She sees a request for monovisc in the portal but is confused as Synvisc was previously approved. Do we want to pursue approval on Monovisc as well?

## 2020-07-12 NOTE — Telephone Encounter (Signed)
I have cancelled the case with in the [employer] portal patient is approved for synvisc as that is whats preferred by insurance. Document scanned

## 2020-07-19 ENCOUNTER — Ambulatory Visit: Payer: BC Managed Care – PPO | Admitting: Family Medicine

## 2020-08-24 ENCOUNTER — Ambulatory Visit: Payer: BC Managed Care – PPO | Admitting: Family Medicine

## 2020-08-30 ENCOUNTER — Encounter: Payer: Self-pay | Admitting: Family Medicine

## 2020-08-30 ENCOUNTER — Ambulatory Visit (INDEPENDENT_AMBULATORY_CARE_PROVIDER_SITE_OTHER): Payer: BC Managed Care – PPO | Admitting: Family Medicine

## 2020-08-30 ENCOUNTER — Ambulatory Visit: Payer: Self-pay

## 2020-08-30 ENCOUNTER — Other Ambulatory Visit: Payer: Self-pay

## 2020-08-30 VITALS — BP 110/70 | HR 78 | Ht 72.0 in | Wt 229.8 lb

## 2020-08-30 DIAGNOSIS — G8929 Other chronic pain: Secondary | ICD-10-CM | POA: Diagnosis not present

## 2020-08-30 DIAGNOSIS — M1711 Unilateral primary osteoarthritis, right knee: Secondary | ICD-10-CM

## 2020-08-30 DIAGNOSIS — M25561 Pain in right knee: Secondary | ICD-10-CM

## 2020-08-30 DIAGNOSIS — M25562 Pain in left knee: Secondary | ICD-10-CM | POA: Diagnosis not present

## 2020-08-30 DIAGNOSIS — M1712 Unilateral primary osteoarthritis, left knee: Secondary | ICD-10-CM | POA: Diagnosis not present

## 2020-08-30 MED ORDER — MELOXICAM 15 MG PO TABS: 15.0000 mg | ORAL_TABLET | Freq: Every day | ORAL | 0 refills | Status: DC

## 2020-08-30 NOTE — Patient Instructions (Addendum)
Thank you for coming in today.  Schedule weekly for the next 2 weeks for injections 2 and 3 of both knees.   Take meloxicam daily as needed for pain.  OK to take with tylenol. Not ok to take with ibuprofen or aleve.

## 2020-08-30 NOTE — Progress Notes (Signed)
I, [name], LAT, ATC, am serving as scribe for Dr. [name].  Devin Hoffman is a 39 y.o. male who presents to [facility] at [facility] today for f/u of chronic B knee pain.  He was last seen by Dr. Denyse Amass on 05/26/20 for worsening R knee pain and newer L knee pain and had B knee injections.  He has been approved for Synvisc.  Since his last visit, pt reports that he had to run to help his son's friend who was having an asthma attack last week and is now having worsening pain in his B knees.  He locates his knee pain to his B medial knees.  He feels like his knees might be swollen.  He reports R knee mechanical symptoms.  Diagnostic imaging: R knee XR- 04/21/20   Pertinent review of systems: No fevers or chills  Relevant historical information: Drowsy meloxicam in the past   Exam:  BP 110/70 (BP Location: Right Arm, Patient Position: Sitting, Cuff Size: Large)   Pulse 78   Ht 6' (1.829 m)   Wt 229 lb 12.8 oz (104.2 kg)   SpO2 96%   BMI 31.17 kg/m  General: Well Developed, well nourished, and in no acute distress.   MSK: Bilateral knees no effusion.  Tender palpation medial joint line.  Negative McMurray's test.    Lab and Radiology Results Synvisc injection bilateral knees 1/3  Procedure: Real-time Ultrasound Guided Injection of right knee superior lateral patellar space Device: Philips Affiniti 50G Images permanently stored and available for review in PACS Verbal informed consent obtained.  Discussed risks and benefits of procedure. Warned about infection bleeding damage to structures skin hypopigmentation and fat atrophy among others. Patient expresses understanding and agreement Time-out conducted.   Noted no overlying erythema, induration, or other signs of local infection.   Skin prepped in a sterile fashion.   Local anesthesia: Topical Ethyl chloride.   With sterile technique and under real time ultrasound guidance:  Synvisc injected into  joint. Fluid seen entering the joint capsule.   Completed without difficulty     Advised to call if fevers/chills, erythema, induration, drainage, or persistent bleeding.   Images permanently stored and available for review in the ultrasound unit.  Impression: Technically successful ultrasound guided injection.   Procedure: Real-time Ultrasound Guided Injection of left knee superior lateral patellar space Device: Philips Affiniti 50G Images permanently stored and available for review in PACS Verbal informed consent obtained.  Discussed risks and benefits of procedure. Warned about infection bleeding damage to structures skin hypopigmentation and fat atrophy among others. Patient expresses understanding and agreement Time-out conducted.   Noted no overlying erythema, induration, or other signs of local infection.   Skin prepped in a sterile fashion.   Local anesthesia: Topical Ethyl chloride.   With sterile technique and under real time ultrasound guidance:  Synvisc injected into joint. Fluid seen entering the joint capsule.   Completed without difficulty    Advised to call if fevers/chills, erythema, induration, drainage, or persistent bleeding.   Images permanently stored and available for review in the ultrasound unit.  Impression: Technically successful ultrasound guided injection.    Lot number [id number] for both   Assessment and Plan: 39 y.o. 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  It still possible that some of his pain is due to meniscus or other structural injury.  Discussed options with patient and we will proceed with Synvisc series which have already been approved.  Additionally will prescribe meloxicam.  Return next week for previously arranged Synvisc injection bilateral knees 2/3.   PDMP not reviewed this encounter. Orders Placed This Encounter  Procedures  . [place] LIMITED JOINT SPACE STRUCTURES LOW BILAT(NO LINKED CHARGES)    Order Specific Question:    Reason for Exam (SYMPTOM  OR DIAGNOSIS REQUIRED)    Answer:   B knee pain    Order Specific Question:   Preferred imaging location?    Answer:   [facility] ordered this encounter  Medications  . meloxicam (MOBIC) 15 MG tablet    Sig: Take 1 tablet (15 mg total) by mouth daily.    Dispense:  30 tablet    Refill:  0     Discussed warning signs or symptoms. Please see discharge instructions. Patient expresses understanding.   The above documentation has been reviewed and is accurate and complete [name], M.D.

## 2020-09-06 ENCOUNTER — Ambulatory Visit (INDEPENDENT_AMBULATORY_CARE_PROVIDER_SITE_OTHER): Payer: BC Managed Care – PPO | Admitting: Family Medicine

## 2020-09-06 ENCOUNTER — Ambulatory Visit: Payer: Self-pay

## 2020-09-06 ENCOUNTER — Other Ambulatory Visit: Payer: Self-pay

## 2020-09-06 DIAGNOSIS — M170 Bilateral primary osteoarthritis of knee: Secondary | ICD-10-CM | POA: Diagnosis not present

## 2020-09-06 DIAGNOSIS — M1712 Unilateral primary osteoarthritis, left knee: Secondary | ICD-10-CM

## 2020-09-06 DIAGNOSIS — G8929 Other chronic pain: Secondary | ICD-10-CM

## 2020-09-06 DIAGNOSIS — M1711 Unilateral primary osteoarthritis, right knee: Secondary | ICD-10-CM

## 2020-09-06 DIAGNOSIS — M25561 Pain in right knee: Secondary | ICD-10-CM

## 2020-09-06 DIAGNOSIS — M25562 Pain in left knee: Secondary | ICD-10-CM

## 2020-09-06 MED ORDER — DICLOFENAC SODIUM 75 MG PO TBEC: 75.0000 mg | DELAYED_RELEASE_TABLET | Freq: Two times a day (BID) | ORAL | 1 refills | Status: DC | PRN

## 2020-09-06 NOTE — Patient Instructions (Signed)
Thank you for coming in today.  Call or go to the ER if you develop a large red swollen joint with extreme pain or oozing puss.   Return in 1 week for injection 3/3

## 2020-09-06 NOTE — Progress Notes (Signed)
Devin Hoffman presents to clinic today for Synvisc injection bilateral knees 2/2  Procedure: Real-time Ultrasound Guided Injection of right knee superior lateral patellar space Device: Philips Affiniti 50G Images permanently stored and available for review in PACS Verbal informed consent obtained.  Discussed risks and benefits of procedure. Warned about infection bleeding damage to structures skin hypopigmentation and fat atrophy among others. Patient expresses understanding and agreement Time-out conducted.   Noted no overlying erythema, induration, or other signs of local infection.   Skin prepped in a sterile fashion.   Local anesthesia: Topical Ethyl chloride.   With sterile technique and under real time ultrasound guidance:  Synvisc injected into the joint  Advised to call if fevers/chills, erythema, induration, drainage, or persistent bleeding.   Images permanently stored and available for review in the ultrasound unit.  Impression: Technically successful ultrasound guided injection.     Procedure: Real-time Ultrasound Guided Injection of left knee superior lateral patellar space Device: Philips Affiniti 50G Images permanently stored and available for review in PACS Verbal informed consent obtained.  Discussed risks and benefits of procedure. Warned about infection bleeding damage to structures skin hypopigmentation and fat atrophy among others. Patient expresses understanding and agreement Time-out conducted.   Noted no overlying erythema, induration, or other signs of local infection.   Skin prepped in a sterile fashion.   Local anesthesia: Topical Ethyl chloride.   With sterile technique and under real time ultrasound guidance:  Synvisc injected into joint. Fluid seen entering the joint.   Completed without difficulty   Advised to call if fevers/chills, erythema, induration, drainage, or persistent bleeding.   Images permanently stored and available for review in the ultrasound unit.   Impression: Technically successful ultrasound guided injection.    Lot number: [id number] for both.   Patient mentions that he does not feel any better after the first injection.  We will proceed with injection #2 today however if no better after that he will notify me and we will consider canceling the third injection.  Additionally meloxicam was not helpful we will switch to oral diclofenac as that has worked better in the past.  If not better at all may consider MRI

## 2020-09-13 ENCOUNTER — Other Ambulatory Visit: Payer: Self-pay

## 2020-09-13 ENCOUNTER — Ambulatory Visit: Payer: BC Managed Care – PPO | Admitting: Family Medicine

## 2020-09-13 ENCOUNTER — Ambulatory Visit: Payer: Self-pay

## 2020-09-13 DIAGNOSIS — M25561 Pain in right knee: Secondary | ICD-10-CM | POA: Diagnosis not present

## 2020-09-13 DIAGNOSIS — G8929 Other chronic pain: Secondary | ICD-10-CM

## 2020-09-13 DIAGNOSIS — M170 Bilateral primary osteoarthritis of knee: Secondary | ICD-10-CM | POA: Diagnosis not present

## 2020-09-13 DIAGNOSIS — M25562 Pain in left knee: Secondary | ICD-10-CM

## 2020-09-13 DIAGNOSIS — M1711 Unilateral primary osteoarthritis, right knee: Secondary | ICD-10-CM

## 2020-09-13 DIAGNOSIS — M1712 Unilateral primary osteoarthritis, left knee: Secondary | ICD-10-CM

## 2020-09-13 NOTE — Progress Notes (Signed)
Devin Hoffman presents to clinic today for synvisc injection BL knee   He notes the left knee is feeling a lot better than the right knee.  He notes the right knee continues to have pain in the anterior medial knee and hip like to try a bent knee anterior medial injection if possible as he has had some benefit with that in the past.  Injection right knee: Consent obtained and timeout performed. Risks and benefits reviewed. Bent knee inspected and palpated localizing fat pad just medial to patella tendon. Skin sterilized with isopropyl alcohol. Cold spray applied 22-gauge needle used to access the anterior medial knee and Synvisc injected Dressing applied. Patient tolerated procedure well.  Procedure: Real-time Ultrasound Guided Injection of left knee superior lateral approach Device: Philips Affiniti 50G Images permanently stored and available for review in PACS Verbal informed consent obtained.  Discussed risks and benefits of procedure. Warned about infection bleeding damage to structures skin hypopigmentation and fat atrophy among others. Patient expresses understanding and agreement Time-out conducted.   Noted no overlying erythema, induration, or other signs of local infection.   Skin prepped in a sterile fashion.   Local anesthesia: Topical Ethyl chloride.   With sterile technique and under real time ultrasound guidance:  Synvisc injected into joint capsule. Fluid seen entering the joint capsule.   Completed without difficulty    Advised to call if fevers/chills, erythema, induration, drainage, or persistent bleeding.   Images permanently stored and available for review in the ultrasound unit.  Impression: Technically successful ultrasound guided injection.  Lot number [id number] for both.   If right knee does not improve would either proceed with PRP or MRI.

## 2020-09-13 NOTE — Patient Instructions (Addendum)
You had B knee Synvisc injections today.  Call or go to the ER if you develop a large red swollen joint with extreme pain or oozing puss.   If the right knee does not improve let me know. We can proceed to MRI as next step.   Keep me updated.

## 2020-09-21 ENCOUNTER — Ambulatory Visit: Payer: BC Managed Care – PPO | Admitting: Family Medicine

## 2021-02-08 ENCOUNTER — Encounter (HOSPITAL_BASED_OUTPATIENT_CLINIC_OR_DEPARTMENT_OTHER): Payer: Self-pay

## 2021-02-08 ENCOUNTER — Emergency Department (HOSPITAL_BASED_OUTPATIENT_CLINIC_OR_DEPARTMENT_OTHER): Payer: BC Managed Care – PPO

## 2021-02-08 ENCOUNTER — Emergency Department (HOSPITAL_BASED_OUTPATIENT_CLINIC_OR_DEPARTMENT_OTHER)
Admission: EM | Admit: 2021-02-08 | Discharge: 2021-02-08 | Disposition: A | Discharge status: Home / Self Care | Payer: BC Managed Care – PPO | Attending: Emergency Medicine | Admitting: Emergency Medicine

## 2021-02-08 ENCOUNTER — Other Ambulatory Visit: Payer: Self-pay

## 2021-02-08 DIAGNOSIS — S39012A Strain of muscle, fascia and tendon of lower back, initial encounter: Secondary | ICD-10-CM | POA: Diagnosis not present

## 2021-02-08 DIAGNOSIS — S161XXA Strain of muscle, fascia and tendon at neck level, initial encounter: Secondary | ICD-10-CM | POA: Diagnosis not present

## 2021-02-08 DIAGNOSIS — R202 Paresthesia of skin: Secondary | ICD-10-CM | POA: Diagnosis not present

## 2021-02-08 DIAGNOSIS — S3992XA Unspecified injury of lower back, initial encounter: Secondary | ICD-10-CM | POA: Diagnosis present

## 2021-02-08 DIAGNOSIS — R519 Headache, unspecified: Secondary | ICD-10-CM | POA: Diagnosis not present

## 2021-02-08 DIAGNOSIS — Z87891 Personal history of nicotine dependence: Secondary | ICD-10-CM | POA: Diagnosis not present

## 2021-02-08 DIAGNOSIS — R109 Unspecified abdominal pain: Secondary | ICD-10-CM | POA: Insufficient documentation

## 2021-02-08 DIAGNOSIS — Y92410 Unspecified street and highway as the place of occurrence of the external cause: Secondary | ICD-10-CM | POA: Diagnosis not present

## 2021-02-08 DIAGNOSIS — M549 Dorsalgia, unspecified: Secondary | ICD-10-CM

## 2021-02-08 LAB — URINALYSIS, ROUTINE W REFLEX MICROSCOPIC
Bilirubin Urine: NEGATIVE
Glucose, UA: NEGATIVE mg/dL
Hgb urine dipstick: NEGATIVE
Ketones, ur: NEGATIVE mg/dL
Leukocytes,Ua: NEGATIVE
Nitrite: NEGATIVE
Protein, ur: NEGATIVE mg/dL
Specific Gravity, Urine: 1.025 (ref 1.005–1.030)
pH: 6 (ref 5.0–8.0)

## 2021-02-08 LAB — CBC WITH DIFFERENTIAL/PLATELET
Abs Immature Granulocytes: 0.02 10*3/uL (ref 0.00–0.07)
Basophils Absolute: 0 10*3/uL (ref 0.0–0.1)
Basophils Relative: 1 %
Eosinophils Absolute: 0 10*3/uL (ref 0.0–0.5)
Eosinophils Relative: 1 %
HCT: 42.1 % (ref 39.0–52.0)
Hemoglobin: 15 g/dL (ref 13.0–17.0)
Immature Granulocytes: 0 %
Lymphocytes Relative: 29 %
Lymphs Abs: 1.6 10*3/uL (ref 0.7–4.0)
MCH: 31.7 pg (ref 26.0–34.0)
MCHC: 35.6 g/dL (ref 30.0–36.0)
MCV: 89 fL (ref 80.0–100.0)
Monocytes Absolute: 0.4 10*3/uL (ref 0.1–1.0)
Monocytes Relative: 7 %
Neutro Abs: 3.5 10*3/uL (ref 1.7–7.7)
Neutrophils Relative %: 62 %
Platelets: 219 10*3/uL (ref 150–400)
RBC: 4.73 MIL/uL (ref 4.22–5.81)
RDW: 12.3 % (ref 11.5–15.5)
WBC: 5.6 10*3/uL (ref 4.0–10.5)
nRBC: 0 % (ref 0.0–0.2)

## 2021-02-08 LAB — COMPREHENSIVE METABOLIC PANEL
ALT: 42 U/L (ref 0–44)
AST: 25 U/L (ref 15–41)
Albumin: 4.7 g/dL (ref 3.5–5.0)
Alkaline Phosphatase: 44 U/L (ref 38–126)
Anion gap: 9 (ref 5–15)
BUN: 12 mg/dL (ref 6–20)
CO2: 24 mmol/L (ref 22–32)
Calcium: 9.4 mg/dL (ref 8.9–10.3)
Chloride: 103 mmol/L (ref 98–111)
Creatinine, Ser: 1.02 mg/dL (ref 0.61–1.24)
GFR, Estimated: >60 mL/min (ref >60)
Glucose, Bld: 124 mg/dL — ABNORMAL HIGH (ref 70–99)
Potassium: 4 mmol/L (ref 3.5–5.1)
Sodium: 136 mmol/L (ref 135–145)
Total Bilirubin: 0.5 mg/dL (ref 0.3–1.2)
Total Protein: 7.7 g/dL (ref 6.5–8.1)

## 2021-02-08 LAB — LIPASE, BLOOD: Lipase: 32 U/L (ref 11–51)

## 2021-02-08 MED ORDER — CYCLOBENZAPRINE HCL 10 MG PO TABS
10.0000 mg | ORAL_TABLET | Freq: Once | ORAL | Status: AC
  Administered 2021-02-08: 10 mg via ORAL
  Filled 2021-02-08: qty 1

## 2021-02-08 MED ORDER — ACETAMINOPHEN 500 MG PO TABS
1000.0000 mg | ORAL_TABLET | Freq: Once | ORAL | Status: AC
  Administered 2021-02-08: 1000 mg via ORAL
  Filled 2021-02-08: qty 2

## 2021-02-08 MED ORDER — IOHEXOL 300 MG/ML  SOLN
100.0000 mL | Freq: Once | INTRAMUSCULAR | Status: AC | PRN
  Administered 2021-02-08: 100 mL via INTRAVENOUS

## 2021-02-08 MED ORDER — CYCLOBENZAPRINE HCL 10 MG PO TABS: 10.0000 mg | ORAL_TABLET | Freq: Two times a day (BID) | ORAL | 0 refills | Status: DC | PRN

## 2021-02-08 MED ORDER — LIDOCAINE 5 % EX PTCH: 1.0000 | MEDICATED_PATCH | CUTANEOUS | 0 refills | Status: DC

## 2021-02-08 NOTE — ED Notes (Signed)
Pt at CT. Will get vitals when they return.

## 2021-02-08 NOTE — ED Notes (Signed)
Patient transported to CT 
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
## 2021-02-08 NOTE — ED Notes (Signed)
ED Provider at bedside. 
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
## 2021-09-22 ENCOUNTER — Encounter: Payer: Self-pay | Admitting: Family Medicine

## 2021-09-22 ENCOUNTER — Other Ambulatory Visit: Payer: Self-pay

## 2021-09-22 ENCOUNTER — Ambulatory Visit: Payer: BC Managed Care – PPO | Admitting: Family Medicine

## 2021-09-22 VITALS — BP 108/68 | HR 77 | Temp 98.1°F | Ht 72.0 in | Wt 223.0 lb

## 2021-09-22 DIAGNOSIS — F339 Major depressive disorder, recurrent, unspecified: Secondary | ICD-10-CM | POA: Insufficient documentation

## 2021-09-22 DIAGNOSIS — Z114 Encounter for screening for human immunodeficiency virus [HIV]: Secondary | ICD-10-CM | POA: Diagnosis not present

## 2021-09-22 DIAGNOSIS — Z1159 Encounter for screening for other viral diseases: Secondary | ICD-10-CM

## 2021-09-22 DIAGNOSIS — Z0000 Encounter for general adult medical examination without abnormal findings: Secondary | ICD-10-CM

## 2021-09-22 LAB — CBC
HCT: 42.3 % (ref 39.0–52.0)
Hemoglobin: 14.4 g/dL (ref 13.0–17.0)
MCHC: 34 g/dL (ref 30.0–36.0)
MCV: 91.6 fl (ref 78.0–100.0)
Platelets: 212 10*3/uL (ref 150.0–400.0)
RBC: 4.61 Mil/uL (ref 4.22–5.81)
RDW: 13.6 % (ref 11.5–15.5)
WBC: 5.3 10*3/uL (ref 4.0–10.5)

## 2021-09-22 LAB — LIPID PANEL
Cholesterol: 178 mg/dL (ref 0–200)
HDL: 33.1 mg/dL — ABNORMAL LOW (ref >39.00)
NonHDL: 145.27
Total CHOL/HDL Ratio: 5
Triglycerides: 218 mg/dL — ABNORMAL HIGH (ref 0.0–149.0)
VLDL: 43.6 mg/dL — ABNORMAL HIGH (ref 0.0–40.0)

## 2021-09-22 LAB — COMPREHENSIVE METABOLIC PANEL
ALT: 25 U/L (ref 0–53)
AST: 19 U/L (ref 0–37)
Albumin: 4.8 g/dL (ref 3.5–5.2)
Alkaline Phosphatase: 51 U/L (ref 39–117)
BUN: 13 mg/dL (ref 6–23)
CO2: 28 mEq/L (ref 19–32)
Calcium: 9.5 mg/dL (ref 8.4–10.5)
Chloride: 103 mEq/L (ref 96–112)
Creatinine, Ser: 1.07 mg/dL (ref 0.40–1.50)
GFR: 87.82 mL/min (ref >60.00)
Glucose, Bld: 87 mg/dL (ref 70–99)
Potassium: 4.7 mEq/L (ref 3.5–5.1)
Sodium: 138 mEq/L (ref 135–145)
Total Bilirubin: 0.7 mg/dL (ref 0.2–1.2)
Total Protein: 7.3 g/dL (ref 6.0–8.3)

## 2021-09-22 LAB — LDL CHOLESTEROL, DIRECT: Direct LDL: 118 mg/dL

## 2021-09-22 MED ORDER — FLUOXETINE HCL 20 MG PO TABS: 20.0000 mg | ORAL_TABLET | Freq: Every day | ORAL | 2 refills | Status: DC

## 2021-09-22 NOTE — Progress Notes (Signed)
Chief Complaint  Patient presents with   New Patient (Initial Visit)   Depression    Well Male Devin Hoffman is here for a complete physical.   His last physical was >1 year ago.  Current diet: in general, a "healthy" diet.   Current exercise: cycling, rowing, HIIT Weight trend: intentionally decreasing Fatigue out of ordinary? Yes, see below Seat belt? Yes.    Health maintenance Tetanus- Yes HIV- No Hep C- No  Depression 1 mo ago, started having anhedonia, fatigue and depressed mood. His MIL passed away around 1 year ago and the anniversary it coming up and s/s's started to worsen. Not currently following w a therapist. Has done so in the past when he was on Prozac. Failed Cymbalta and Trintellix 2/2 poor sex drive. No self medication or thoughts of harming self or others.   Past Medical History:  Diagnosis Date   Recurrent depression (HCC)      Past Surgical History:  Procedure Laterality Date   HAND SURGERY Left    KNEE SURGERY     Medications  Takes no meds routinely.    Allergies No Known Allergies  Family History Family History  Problem Relation Age of Onset   Alcohol abuse Father    Autism spectrum disorder Brother    Colon cancer Maternal Grandmother    Pancreatic cancer Paternal Grandfather     Review of Systems: Constitutional: no fevers or chills Eye:  no recent significant change in vision Ear/Nose/Mouth/Throat:  Ears:  no hearing loss Nose/Mouth/Throat:  no complaints of nasal congestion, no sore throat Cardiovascular:  no chest pain Respiratory:  no shortness of breath Gastrointestinal:  no abdominal pain, no change in bowel habits GU:  Male: negative for dysuria Musculoskeletal/Extremities:  no new pain of the joints Integumentary (Skin/Breast):  no abnormal skin lesions reported Neurologic:  no headaches Endocrine: No unexpected weight changes Hematologic/Lymphatic:  no night sweats  Exam BP 108/68   Pulse 77   Temp 98.1 F (36.7  C) (Oral)   Ht 6' (1.829 m)   Wt 223 lb (101.2 kg)   SpO2 99%   BMI 30.24 kg/m  General:  well developed, well nourished, in no apparent distress Skin:  no significant moles, warts, or growths Head:  no masses, lesions, or tenderness Eyes:  pupils equal and round, sclera anicteric without injection Ears:  canals without lesions, TMs shiny without retraction, no obvious effusion, no erythema Nose:  nares patent, septum midline, mucosa normal Throat/Pharynx:  lips and gingiva without lesion; tongue and uvula midline; non-inflamed pharynx; no exudates or postnasal drainage Neck: neck supple without adenopathy, thyromegaly, or masses Lungs:  clear to auscultation, breath sounds equal bilaterally, no respiratory distress Cardio:  regular rate and rhythm, no bruits, no LE edema Abdomen:  abdomen soft, nontender; bowel sounds normal; no masses or organomegaly Genital (male): Deferred Rectal: Deferred Musculoskeletal:  symmetrical muscle groups noted without atrophy or deformity Extremities:  no clubbing, cyanosis, or edema, no deformities, no skin discoloration Neuro:  gait normal; deep tendon reflexes normal and symmetric Psych: well oriented with normal range of affect and appropriate judgment/insight  Assessment and Plan  Well adult exam - Plan: CBC, Comprehensive metabolic panel, Lipid panel  Encounter for hepatitis C screening test for low risk patient - Plan: Hepatitis C antibody  Screening for HIV without presence of risk factors - Plan: HIV Antibody (routine testing w rflx)  Depression, recurrent (HCC) - Plan: FLUoxetine (PROZAC) 20 MG tablet   Well 40 y.o. male. Counseled  on diet and exercise. Self testicular exams recommended at least monthly.  Chronic, unstable- [facility] info provided. Counseled on exercise. Start back on Prozac 10 mg/d for 2 weeks and then increase to 20 mg/d. He has been on this in the past and did well.  Other orders as above. Follow up in 6 months to  reck, 6 weeks if not doing well.  The patient voiced understanding and agreement to the plan.  Jilda Roche [place], DO 09/22/21 10:02 AM

## 2021-09-22 NOTE — Patient Instructions (Addendum)
Give [place] 2-3 business days to get the results of your labs back.   Keep the diet clean and stay active.  Do monthly self testicular checks in the shower. You are feeling for lumps/bumps that don't belong. If you feel anything like this, let me know!  I recommend getting the updated bivalent covid vaccination booster at your convenience.   Foods that may reduce pain: 1) Ginger 2) Blueberries 3) Salmon 4) Pumpkin seeds 5) dark chocolate 6) turmeric 7) tart cherries 8) virgin olive oil 9) chilli peppers 10) mint 11) red wine  Take 1/2 tab of the Prozac for 2 weeks and then increase to 1 full tab. If we are not in a good spot in the next 6 weeks, I want to see you.   Aim to do some physical exertion for 150 minutes per week. This is typically divided into 5 days per week, 30 minutes per day. The activity should be enough to get your heart rate up. Anything is better than nothing if you have time constraints.  Please consider counseling. Contact [phone] to schedule an appointment or inquire about cost/insurance coverage.  [facility] located at [address], [place], [place].  Phone number = [phone].  Dr. Regan Lemming - Adult Psychiatry.    [facility] located at [address] [place], [place], [place]. Phone number = [phone].   [facility] located at [address] [place], [place].  Phone number = [phone].   [facility] located at [address] [place], [place], [place].  Phone number = [phone].  Let us know if you need anything.

## 2021-09-24 LAB — HEPATITIS C ANTIBODY
Hepatitis C Ab: NONREACTIVE
SIGNAL TO CUT-OFF: 0.11 (ref <1.00)

## 2021-09-24 LAB — HIV ANTIBODY (ROUTINE TESTING W REFLEX): HIV 1&2 Ab, 4th Generation: NONREACTIVE

## 2021-09-25 ENCOUNTER — Other Ambulatory Visit: Payer: Self-pay | Admitting: Family Medicine

## 2021-09-25 DIAGNOSIS — E785 Hyperlipidemia, unspecified: Secondary | ICD-10-CM

## 2021-10-09 ENCOUNTER — Ambulatory Visit: Payer: BC Managed Care – PPO | Admitting: Family Medicine

## 2021-10-11 NOTE — Progress Notes (Signed)
   Devin Hoffman D.[name] [address] [place] [address] Phone: [phone]   Assessment and Plan:     1. Strain of left trapezius muscle, initial encounter - Acute, uncomplicated, initial visit - Acute strain of trapezius muscle, likely from sleeping based on HPI, physical exam - Patient elected for OMT.  Tolerated well per note below - Take ibuprofen OTC 1 to 2 tablets daily for the next 1 week - Start HEP for neck exercises  2. Somatic dysfunction of cervical region 3. Somatic dysfunction of thoracic region 4. Somatic dysfunction of lumbar region 5. Somatic dysfunction of pelvic region 6. Somatic dysfunction of rib region .-Chronic with exacerbation, subsequent visit - Recurrence of typical musculoskeletal complaints with most prominent being acute neck strain and left-sided sciatica symptoms - Patient has received significant relief with OMT in the past.  Elects for repeat OMT today.  Tolerated well per note below. - Decision today to treat with OMT was based on Physical Exam   After verbal consent patient was treated with HVLA (high velocity low amplitude), ME (muscle energy), FPR (flex positional release), ST (soft tissue), PC/PD (Pelvic Compression/ Pelvic Decompression) techniques in cervical, rib, thoracic, lumbar, and pelvic areas. Patient tolerated the procedure well with improvement in symptoms.  Patient educated on potential side effects of soreness and recommended to rest, hydrate, and use Tylenol as needed for pain control.   Pertinent previous records reviewed include none   Follow Up: 48 weeks for repeat OMT   Subjective:   I, Devin Hoffman, am serving as a scribe for Dr. Richardean Sale  Chief Complaint: Neck pain   HPI:   10/12/21  Patient is a 40 year old male presenting with neck pain. Patient woke up one day with a severe neck cramp and could not even turn his head. Patient states he is better today but neck is still very  tense and is requesting OMT.   Relevant Historical Information: None pertinent  Additional pertinent review of systems negative.  Current Outpatient Medications  Medication Sig Dispense Refill   FLUoxetine (PROZAC) 20 MG tablet Take 1 tablet (20 mg total) by mouth daily. 90 tablet 2   No current facility-administered medications for this visit.      Objective:     Vitals:   10/12/21 1517  BP: 120/84  Pulse: 73  SpO2: 98%  Weight: 222 lb (100.7 kg)  Height: 6' (1.829 m)      Body mass index is 30.11 kg/m.    Physical Exam:     General: Well-appearing, cooperative, sitting comfortably in no acute distress.   OMT Physical Exam:  ASIS Compression Test: Positive Right Cervical: Mild TTP paraspinal, tense left trapezius Rib: Ribs 6-9 inhalation dysfunction on right Thoracic: TTP paraspinal, T6-9 RRSL Lumbar: TTP paraspinal, L2-4 RLSR Pelvis: Left anterior innominate with out flare  Electronically signed by:  Devin Hoffman D.[name] 4:41 PM 10/12/21

## 2021-10-12 ENCOUNTER — Other Ambulatory Visit: Payer: Self-pay

## 2021-10-12 ENCOUNTER — Ambulatory Visit: Payer: BC Managed Care – PPO | Admitting: Sports Medicine

## 2021-10-12 VITALS — BP 120/84 | HR 73 | Ht 72.0 in | Wt 222.0 lb

## 2021-10-12 DIAGNOSIS — M9908 Segmental and somatic dysfunction of rib cage: Secondary | ICD-10-CM

## 2021-10-12 DIAGNOSIS — M9901 Segmental and somatic dysfunction of cervical region: Secondary | ICD-10-CM | POA: Diagnosis not present

## 2021-10-12 DIAGNOSIS — S46812A Strain of other muscles, fascia and tendons at shoulder and upper arm level, left arm, initial encounter: Secondary | ICD-10-CM

## 2021-10-12 DIAGNOSIS — M9903 Segmental and somatic dysfunction of lumbar region: Secondary | ICD-10-CM

## 2021-10-12 DIAGNOSIS — M9902 Segmental and somatic dysfunction of thoracic region: Secondary | ICD-10-CM

## 2021-10-12 DIAGNOSIS — M9905 Segmental and somatic dysfunction of pelvic region: Secondary | ICD-10-CM

## 2021-10-12 NOTE — Patient Instructions (Addendum)
Good to see you  Take ibuprofen 1-2 time a day for 1 week Exercises given  Follow up in 4-8 weeks for repeat OMT

## 2021-11-06 ENCOUNTER — Other Ambulatory Visit: Payer: Self-pay | Admitting: Family Medicine

## 2021-11-06 ENCOUNTER — Encounter: Payer: Self-pay | Admitting: Family Medicine

## 2021-11-06 ENCOUNTER — Other Ambulatory Visit (INDEPENDENT_AMBULATORY_CARE_PROVIDER_SITE_OTHER): Payer: Self-pay

## 2021-11-06 DIAGNOSIS — E785 Hyperlipidemia, unspecified: Secondary | ICD-10-CM | POA: Diagnosis not present

## 2021-11-06 LAB — LIPID PANEL
Cholesterol: 175 mg/dL (ref 0–200)
HDL: 35 mg/dL — ABNORMAL LOW (ref >39.00)
NonHDL: 139.94
Total CHOL/HDL Ratio: 5
Triglycerides: 221 mg/dL — ABNORMAL HIGH (ref 0.0–149.0)
VLDL: 44.2 mg/dL — ABNORMAL HIGH (ref 0.0–40.0)

## 2021-11-06 LAB — LDL CHOLESTEROL, DIRECT: Direct LDL: 118 mg/dL

## 2021-11-06 MED ORDER — ROSUVASTATIN CALCIUM 20 MG PO TABS: 20.0000 mg | ORAL_TABLET | Freq: Every day | ORAL | 3 refills | Status: DC

## 2021-12-05 ENCOUNTER — Encounter: Payer: Self-pay | Admitting: Family Medicine

## 2021-12-05 ENCOUNTER — Ambulatory Visit: Payer: BC Managed Care – PPO | Admitting: Family Medicine

## 2021-12-05 VITALS — BP 108/68 | HR 71 | Temp 98.2°F | Ht 72.0 in | Wt 227.1 lb

## 2021-12-05 DIAGNOSIS — F900 Attention-deficit hyperactivity disorder, predominantly inattentive type: Secondary | ICD-10-CM

## 2021-12-05 DIAGNOSIS — R55 Syncope and collapse: Secondary | ICD-10-CM | POA: Diagnosis not present

## 2021-12-05 MED ORDER — AMPHETAMINE-DEXTROAMPHET ER 20 MG PO CP24: 20.0000 mg | ORAL_CAPSULE | Freq: Every day | ORAL | 0 refills | Status: DC

## 2021-12-05 NOTE — Progress Notes (Signed)
Chief Complaint  Patient presents with   Follow-up   vasovagal    Subjective: Patient is a 41 y.o. male here for follow up.  Pt w hx of vasovagal syncope after laughing. Recently passed out on 12/24 after hearing some bad news. He has been eating and drinking well. No changes, was not sick or having any fevers during that time. He was not heartily laughing. He had a stress test/stress echo at the end of 2020 that was normal.   ADHD Hx of ADHD. Stopped in past when he had anxiety issues. He went on Prozac and notes marked improvement in his anxiety, but worsening of his ADHD. Was on Adderall XR 20 mg/d in the past that worked well. Would like to restart.   Past Medical History:  Diagnosis Date   Recurrent depression (HCC)     Objective: BP 108/68    Pulse 71    Temp 98.2 F (36.8 C) (Oral)    Ht 6' (1.829 m)    Wt 227 lb 2 oz (103 kg)    SpO2 99%    BMI 30.80 kg/m  General: Awake, appears stated age Heart: RRR, no LE edema, no bruits. Neuro: 5/5 strength throughout, DTR's equal and symmetric throughout, no clonus, no cerebellar signs, gait nml Lungs: CTAB, no rales, wheezes or rhonchi. No accessory muscle use Psych: Age appropriate judgment and insight, normal affect and mood  Assessment and Plan: Syncope, unspecified syncope type  Attention deficit hyperactivity disorder (ADHD), predominantly inattentive type - Plan: amphetamine-dextroamphetamine (ADDERALL XR) 20 MG 24 hr capsule  Recurrence of issue. Nml exam. Discussed unlikely probability that EKG or labs would be abn. Offered referral to cardiology, standing offer, politely declined.  Chronic, uncontrolled. Restart Adderall XR 20 mg/d. F/u in 1 mo.  The patient voiced understanding and agreement to the plan.  Jilda Roche [place], DO 12/05/21  4:48 PM

## 2021-12-05 NOTE — Patient Instructions (Signed)
If you want to see a heart doc, let know.  Look out for lightheadedness.   Ease back into your workouts. Try to avoid things that could bring serious harm if you were to drop it.   Stay hydrated.  Let us know if you need anything.

## 2021-12-18 ENCOUNTER — Ambulatory Visit: Payer: BC Managed Care – PPO | Admitting: Family Medicine

## 2021-12-18 ENCOUNTER — Encounter: Payer: Self-pay | Admitting: Family Medicine

## 2021-12-18 ENCOUNTER — Telehealth: Payer: Self-pay | Admitting: Family Medicine

## 2021-12-18 VITALS — BP 122/72 | HR 95 | Temp 98.0°F | Ht 72.0 in | Wt 224.0 lb

## 2021-12-18 DIAGNOSIS — F900 Attention-deficit hyperactivity disorder, predominantly inattentive type: Secondary | ICD-10-CM

## 2021-12-18 DIAGNOSIS — H65192 Other acute nonsuppurative otitis media, left ear: Secondary | ICD-10-CM | POA: Diagnosis not present

## 2021-12-18 DIAGNOSIS — F339 Major depressive disorder, recurrent, unspecified: Secondary | ICD-10-CM | POA: Diagnosis not present

## 2021-12-18 DIAGNOSIS — E785 Hyperlipidemia, unspecified: Secondary | ICD-10-CM | POA: Diagnosis not present

## 2021-12-18 LAB — LIPID PANEL
Cholesterol: 133 mg/dL (ref 0–200)
HDL: 41.3 mg/dL (ref >39.00)
LDL Cholesterol: 73 mg/dL (ref 0–99)
NonHDL: 92.04
Total CHOL/HDL Ratio: 3
Triglycerides: 96 mg/dL (ref 0.0–149.0)
VLDL: 19.2 mg/dL (ref 0.0–40.0)

## 2021-12-18 LAB — HEPATIC FUNCTION PANEL
ALT: 41 U/L (ref 0–53)
AST: 24 U/L (ref 0–37)
Albumin: 4.9 g/dL (ref 3.5–5.2)
Alkaline Phosphatase: 54 U/L (ref 39–117)
Bilirubin, Direct: 0.1 mg/dL (ref 0.0–0.3)
Total Bilirubin: 0.7 mg/dL (ref 0.2–1.2)
Total Protein: 7.6 g/dL (ref 6.0–8.3)

## 2021-12-18 MED ORDER — METHYLPHENIDATE HCL ER (OSM) 36 MG PO TBCR: 36.0000 mg | EXTENDED_RELEASE_TABLET | Freq: Every day | ORAL | 0 refills | Status: DC

## 2021-12-18 MED ORDER — CIPROFLOXACIN-DEXAMETHASONE 0.3-0.1 % OT SUSP: 4.0000 [drp] | Freq: Two times a day (BID) | OTIC | 0 refills | Status: DC

## 2021-12-18 MED ORDER — PREDNISONE 20 MG PO TABS: 40.0000 mg | ORAL_TABLET | Freq: Every day | ORAL | 0 refills | Status: DC

## 2021-12-18 MED ORDER — PREDNISONE 20 MG PO TABS: 40.0000 mg | ORAL_TABLET | Freq: Every day | ORAL | 0 refills | Status: AC

## 2021-12-18 NOTE — Patient Instructions (Addendum)
OK to take Tylenol 1000 mg (2 extra strength tabs) or 975 mg (3 regular strength tabs) every 6 hours as needed.  Give [place] 2-3 business days to get the results of your labs back.   Stop the Adderall. Let me know if there are cost issues with the new medicine.   Let us know if you need anything.

## 2021-12-18 NOTE — Progress Notes (Signed)
Chief Complaint  Patient presents with   Ear Pain    Left ear     Pt is here for left ear pain. Duration: 1  week Progression: worse Associated symptoms: clear drainage, neck pain, low grade fevers (99.7 F) Denies: sore throat, congestion, post nasal drip, coryza, sneezing, bleeding, hearing loss, injury, trauma Treatment to date: Sudafed, Dayquil  ADHD-was recently started back on Adderall XR 20 mg daily.  He reports compliance without adverse effects.  Since starting, he has not noticed any improvement in any of his symptoms.  He is interested in changing medications.  He has never been on any other medication at this time.  Past Medical History:  Diagnosis Date   Recurrent depression (HCC)     BP 122/72    Pulse 95    Temp 98 F (36.7 C) (Oral)    Ht 6' (1.829 m)    Wt 224 lb (101.6 kg)    SpO2 98%    BMI 30.38 kg/m  General: Awake, alert, appearing stated age HEENT:  L ear- Canal patent without drainage or erythema, TM is slightly bulging with serous fluid, no erythema R ear- canal patent without drainage or erythema, TM is neg Nose- nares patent and without discharge Mouth- Lips, gums and dentition unremarkable, pharynx is without erythema or exudate Neck: No adenopathy Heart: RRR Lungs: CTAB. Normal effort, no accessory muscle use Psych: Age appropriate judgment and insight, normal mood and affect  Acute effusion of left ear - Plan: predniSONE (DELTASONE) 20 MG tablet  Hyperlipidemia, unspecified hyperlipidemia type - Plan: Lipid panel, Hepatic function panel  Attention deficit hyperactivity disorder (ADHD), predominantly inattentive type - Plan: methylphenidate (CONCERTA) 36 MG PO CR tablet  Depression, recurrent (HCC), Chronic  5 bid prednisone burst 40 mg daily.  We will send in antibiotic drops as he has noted some drainage from his ear. Check up on labs. Chronic, uncontrolled.  Stop Adderall, start Concerta 36 mg daily.  Follow-up as originally scheduled. F/u  prn otherwise.  Pt voiced understanding and agreement to the plan.  Jilda Roche [place], DO 12/18/21 9:35 AM

## 2021-12-18 NOTE — Telephone Encounter (Signed)
Pts wife called to let us know that the [facility] did not have the medication prescribed to her husband today. She would like for all of them to be sent to [facility] on [address].

## 2021-12-18 NOTE — Telephone Encounter (Signed)
Can you resent Concerta to [facility]

## 2022-01-29 ENCOUNTER — Other Ambulatory Visit: Payer: Self-pay | Admitting: Family Medicine

## 2022-01-29 ENCOUNTER — Encounter: Payer: Self-pay | Admitting: Family Medicine

## 2022-01-29 DIAGNOSIS — F900 Attention-deficit hyperactivity disorder, predominantly inattentive type: Secondary | ICD-10-CM

## 2022-01-29 MED ORDER — METHYLPHENIDATE HCL ER (OSM) 36 MG PO TBCR: 36.0000 mg | EXTENDED_RELEASE_TABLET | Freq: Every day | ORAL | 0 refills | Status: DC

## 2022-01-29 NOTE — Telephone Encounter (Signed)
Requesting: Concerta 36MG  ?Contract: None  ?UDS: None ?Last Visit: 12/18/21 ?Next Visit:  03/23/22 ?Last Refill: 12/18/21, #30, 0 refills ? ?Please Advise  ?

## 2022-02-05 ENCOUNTER — Other Ambulatory Visit: Payer: Self-pay

## 2022-02-05 DIAGNOSIS — F900 Attention-deficit hyperactivity disorder, predominantly inattentive type: Secondary | ICD-10-CM

## 2022-02-05 MED ORDER — METHYLPHENIDATE HCL ER (OSM) 36 MG PO TBCR: 36.0000 mg | EXTENDED_RELEASE_TABLET | Freq: Every day | ORAL | 0 refills | Status: DC

## 2022-02-05 NOTE — Progress Notes (Signed)
Pt stated the Concerta that was sent in on 3/6 is unavailable at [facility]. I have called and canceled the prescription. Please send to [facility] [address]. Thanks. ? ? ?Requesting: Concerta 36MG  ?Contract: None  ?UDS: None ?Last Visit: 12/18/21 ?Next Visit: 03/23/22 ?Last Refill: 12/18/21 ? ? ?

## 2022-03-13 ENCOUNTER — Other Ambulatory Visit: Payer: Self-pay | Admitting: Family Medicine

## 2022-03-13 DIAGNOSIS — F900 Attention-deficit hyperactivity disorder, predominantly inattentive type: Secondary | ICD-10-CM

## 2022-03-13 MED ORDER — METHYLPHENIDATE HCL ER (OSM) 36 MG PO TBCR: 36.0000 mg | EXTENDED_RELEASE_TABLET | Freq: Every day | ORAL | 0 refills | Status: DC

## 2022-03-13 NOTE — Telephone Encounter (Signed)
Requesting: Concerta 36mg  ?Contract: none ?UDS: none ?Last Visit: 12/18/21 ?Next Visit: 03/23/22 ?Last Refill: 02/28/22 ? ?Please Advise ? ?

## 2022-03-23 ENCOUNTER — Ambulatory Visit: Payer: BC Managed Care – PPO | Admitting: Family Medicine

## 2022-03-23 ENCOUNTER — Encounter: Payer: Self-pay | Admitting: Family Medicine

## 2022-03-23 VITALS — BP 120/76 | HR 78 | Temp 97.1°F | Ht 72.0 in | Wt 230.5 lb

## 2022-03-23 DIAGNOSIS — E782 Mixed hyperlipidemia: Secondary | ICD-10-CM

## 2022-03-23 DIAGNOSIS — Z79899 Other long term (current) drug therapy: Secondary | ICD-10-CM

## 2022-03-23 DIAGNOSIS — F339 Major depressive disorder, recurrent, unspecified: Secondary | ICD-10-CM

## 2022-03-23 DIAGNOSIS — F900 Attention-deficit hyperactivity disorder, predominantly inattentive type: Secondary | ICD-10-CM | POA: Diagnosis not present

## 2022-03-23 DIAGNOSIS — E785 Hyperlipidemia, unspecified: Secondary | ICD-10-CM | POA: Insufficient documentation

## 2022-03-23 LAB — LIPID PANEL
Cholesterol: 124 mg/dL (ref 0–200)
HDL: 33.4 mg/dL — ABNORMAL LOW (ref >39.00)
NonHDL: 90.81
Total CHOL/HDL Ratio: 4
Triglycerides: 221 mg/dL — ABNORMAL HIGH (ref 0.0–149.0)
VLDL: 44.2 mg/dL — ABNORMAL HIGH (ref 0.0–40.0)

## 2022-03-23 LAB — COMPREHENSIVE METABOLIC PANEL
ALT: 24 U/L (ref 0–53)
AST: 18 U/L (ref 0–37)
Albumin: 4.6 g/dL (ref 3.5–5.2)
Alkaline Phosphatase: 44 U/L (ref 39–117)
BUN: 14 mg/dL (ref 6–23)
CO2: 27 mEq/L (ref 19–32)
Calcium: 9.2 mg/dL (ref 8.4–10.5)
Chloride: 103 mEq/L (ref 96–112)
Creatinine, Ser: 0.96 mg/dL (ref 0.40–1.50)
GFR: 99.68 mL/min (ref >60.00)
Glucose, Bld: 73 mg/dL (ref 70–99)
Potassium: 4.1 mEq/L (ref 3.5–5.1)
Sodium: 136 mEq/L (ref 135–145)
Total Bilirubin: 0.6 mg/dL (ref 0.2–1.2)
Total Protein: 6.9 g/dL (ref 6.0–8.3)

## 2022-03-23 LAB — LDL CHOLESTEROL, DIRECT: Direct LDL: 69 mg/dL

## 2022-03-23 MED ORDER — LEVOCETIRIZINE DIHYDROCHLORIDE 5 MG PO TABS: 5.0000 mg | ORAL_TABLET | Freq: Every evening | ORAL | 2 refills | Status: DC

## 2022-03-23 NOTE — Patient Instructions (Addendum)
Give us 2-3 business days to get the results of your labs back.   Keep the diet clean and stay active.  Claritin (loratadine), Allegra (fexofenadine), Zyrtec (cetirizine) which is also equivalent to Xyzal (levocetirizine); these are listed in order from weakest to strongest. Generic, and therefore cheaper, options are in the parentheses.   Flonase (fluticasone); nasal spray that is over the counter. 2 sprays each nostril, once daily. Aim towards the same side eye when you spray.  There are available OTC, and the generic versions, which may be cheaper, are in parentheses. Show this to a pharmacist if you have trouble finding any of these items.  Let us know if you need anything. 

## 2022-03-23 NOTE — Progress Notes (Signed)
Chief Complaint  ?Patient presents with  ? Follow-up  ? ? ?Devin Hoffman is 41 y.o. male here for ADHD follow up. ? ?Patient is currently on Concerta 36 mg/d and compliance is excellent. ?Symptoms are well controlled.  ?Side effects include: none. ?Patient believes their dose should be not significantly changed. ?Denies tics, weight loss, difficulties with sleep, self-medication, alcohol/drug abuse, chest pain, or palpitations. ? ?Depression- well controlled on Prozac 20 mg/d. Compliant, no AE's. No SI or HI. No self medication. Not currently following w a counselor or psychologist.  ? ?Hyperlipidemia ?Patient presents for mixed hyperlipidemia follow up. ?Currently being treated with Crestor 20 mg/d and compliance with treatment thus far has been good. ?He denies myalgias. ?He is usually adhering to a healthy diet. ?Exercise: cycling, rowing ?The patient is not known to have coexisting coronary artery disease. ?No CP or SOB. ? ?Past Medical History:  ?Diagnosis Date  ? Recurrent depression ([place])   ? ? ?BP 120/76   Pulse 78   Temp (!) 97.1 ?F (36.2 ?C) (Oral)   Ht 6' (1.829 m)   Wt 230 lb 8 oz (104.6 kg)   SpO2 98%   BMI 31.26 kg/m?  ?Gen- awake, alert, appearing stated age ?Heart- RRR ?Lungs- CTAB, no accessory muscle use ?Neuro- no facial tics, patellar dtr's equal and symmetric b/l ?50- age appropriate judgment and insight, normal mood and affect ? ?Attention deficit hyperactivity disorder (ADHD), predominantly inattentive type ? ?Depression, recurrent ([place]) ? ?Mixed hyperlipidemia ? ?Chronic, stable. Cont Concerta 36 mg/d. Update CSC and UDS.  ?Chronic, stable. Cont Prozac 20 mg/d.  ?Chronic, stable. Cont Crestor 20 mg/d. Counseled on diet/exercise.  ?F/u in 6 mo for CPE. ?Pt voiced understanding and agreement to the plan. ? ?Shelda Pal, DO ?03/23/22 ?10:17 AM ? ? ?

## 2022-03-26 ENCOUNTER — Encounter: Payer: Self-pay | Admitting: Family Medicine

## 2022-03-26 ENCOUNTER — Other Ambulatory Visit: Payer: Self-pay | Admitting: Family Medicine

## 2022-03-26 DIAGNOSIS — E782 Mixed hyperlipidemia: Secondary | ICD-10-CM

## 2022-03-26 LAB — DRUG MONITORING PANEL 376104, URINE
Amphetamines: NEGATIVE ng/mL (ref <500)
Barbiturates: NEGATIVE ng/mL (ref <300)
Benzodiazepines: NEGATIVE ng/mL (ref <100)
Cocaine Metabolite: NEGATIVE ng/mL (ref <150)
Desmethyltramadol: NEGATIVE ng/mL (ref <100)
Noroxycodone: 299 ng/mL — ABNORMAL HIGH (ref <50)
Opiates: NEGATIVE ng/mL (ref <100)
Oxycodone: 153 ng/mL — ABNORMAL HIGH (ref <50)
Oxycodone: POSITIVE ng/mL — AB (ref <100)
Oxymorphone: NEGATIVE ng/mL (ref <50)
Tramadol: NEGATIVE ng/mL (ref <100)

## 2022-03-26 LAB — DM TEMPLATE

## 2022-03-30 ENCOUNTER — Other Ambulatory Visit: Payer: Self-pay | Admitting: Family Medicine

## 2022-03-30 ENCOUNTER — Encounter: Payer: Self-pay | Admitting: Family Medicine

## 2022-03-30 MED ORDER — DICLOFENAC SODIUM 75 MG PO TBEC: 75.0000 mg | DELAYED_RELEASE_TABLET | Freq: Two times a day (BID) | ORAL | 0 refills | Status: DC

## 2022-04-27 ENCOUNTER — Other Ambulatory Visit: Payer: Self-pay | Admitting: Family Medicine

## 2022-04-27 DIAGNOSIS — F900 Attention-deficit hyperactivity disorder, predominantly inattentive type: Secondary | ICD-10-CM

## 2022-04-27 MED ORDER — METHYLPHENIDATE HCL ER (OSM) 36 MG PO TBCR: 36.0000 mg | EXTENDED_RELEASE_TABLET | Freq: Every day | ORAL | 0 refills | Status: DC

## 2022-04-27 NOTE — Telephone Encounter (Signed)
Requesting: concerta 36mg  Contract: 03/23/22 UDS: 03/23/22 Last Visit:03/23/22 Next Visit: none Last Refill: 03/13/22  Please Advise

## 2022-05-28 ENCOUNTER — Other Ambulatory Visit: Payer: Self-pay | Admitting: Family Medicine

## 2022-05-28 ENCOUNTER — Encounter: Payer: Self-pay | Admitting: Family Medicine

## 2022-05-28 MED ORDER — ATORVASTATIN CALCIUM 40 MG PO TABS: 40.0000 mg | ORAL_TABLET | Freq: Every day | ORAL | 3 refills | Status: DC

## 2022-06-21 ENCOUNTER — Ambulatory Visit: Payer: BC Managed Care – PPO | Admitting: Family Medicine

## 2022-06-21 ENCOUNTER — Encounter: Payer: Self-pay | Admitting: Family Medicine

## 2022-06-21 VITALS — BP 120/82 | HR 94 | Temp 98.6°F | Ht 72.0 in | Wt 231.0 lb

## 2022-06-21 DIAGNOSIS — F900 Attention-deficit hyperactivity disorder, predominantly inattentive type: Secondary | ICD-10-CM

## 2022-06-21 DIAGNOSIS — E782 Mixed hyperlipidemia: Secondary | ICD-10-CM

## 2022-06-21 MED ORDER — PRAVASTATIN SODIUM 10 MG PO TABS: 10.0000 mg | ORAL_TABLET | Freq: Every day | ORAL | 2 refills | Status: DC

## 2022-06-21 MED ORDER — AMPHETAMINE-DEXTROAMPHET ER 30 MG PO CP24: 30.0000 mg | ORAL_CAPSULE | ORAL | 0 refills | Status: DC

## 2022-06-21 NOTE — Progress Notes (Signed)
Chief Complaint  Patient presents with   Follow-up    Medication problem    Subjective: Patient is a 41 y.o. male here for cholesterol.  Patient has a history of mixed hyperlipidemia.  He failed Crestor due to joint/body aches and recently had the same thing happened with Lipitor.  His diet is healthy overall and he exercises routinely.  He is not having any chest pain or shortness of breath.  He has a history of ADHD on Concerta intermittently.  He did take some of his wife's Adderall and reports this made him feel better and decreased his anxiety levels compared to Concerta.  He would like to change.  No adverse effects otherwise.  Past Medical History:  Diagnosis Date   Recurrent depression (HCC)     Objective: BP 120/82   Pulse 94   Temp 98.6 F (37 C) (Oral)   Ht 6' (1.829 m)   Wt 231 lb (104.8 kg)   SpO2 96%   BMI 31.33 kg/m  General: Awake, appears stated age Heart: RRR, no LE edema, no bruits Lungs: CTAB, no rales, wheezes or rhonchi. No accessory muscle use Psych: Age appropriate judgment and insight, normal affect and mood  Assessment and Plan: Mixed hyperlipidemia - Plan: pravastatin (PRAVACHOL) 10 MG tablet  Attention deficit hyperactivity disorder (ADHD), predominantly inattentive type  Adverse effect of chronic medication.  Stop Lipitor, start pravastatin 10 mg daily.  Stay hydrated.  Counseled on diet and exercise.  We will recheck labs in 4 weeks. Adverse effect of chronic medication.  Stop Concerta, start Adderall XR 30 mg daily.  Follow-up in 1 month. The patient voiced understanding and agreement to the plan.  Jilda Roche [place], DO 06/21/22  3:21 PM

## 2022-06-21 NOTE — Patient Instructions (Signed)
Send me a message if there are cost/supply issues.  Keep the diet clean and stay active.  Let us know if you need anything.

## 2022-07-09 ENCOUNTER — Encounter: Payer: Self-pay | Admitting: Family Medicine

## 2022-07-09 DIAGNOSIS — F339 Major depressive disorder, recurrent, unspecified: Secondary | ICD-10-CM

## 2022-07-09 MED ORDER — FLUOXETINE HCL 20 MG PO TABS: 20.0000 mg | ORAL_TABLET | Freq: Every day | ORAL | 2 refills | Status: DC

## 2022-07-24 ENCOUNTER — Ambulatory Visit: Payer: BC Managed Care – PPO | Admitting: Family Medicine

## 2022-07-24 ENCOUNTER — Encounter: Payer: Self-pay | Admitting: Family Medicine

## 2022-07-24 VITALS — BP 110/80 | HR 78 | Temp 98.1°F | Resp 14 | Ht 72.0 in | Wt 225.4 lb

## 2022-07-24 DIAGNOSIS — E785 Hyperlipidemia, unspecified: Secondary | ICD-10-CM

## 2022-07-24 DIAGNOSIS — F900 Attention-deficit hyperactivity disorder, predominantly inattentive type: Secondary | ICD-10-CM | POA: Diagnosis not present

## 2022-07-24 LAB — COMPREHENSIVE METABOLIC PANEL
ALT: 31 U/L (ref 0–53)
AST: 19 U/L (ref 0–37)
Albumin: 4.6 g/dL (ref 3.5–5.2)
Alkaline Phosphatase: 52 U/L (ref 39–117)
BUN: 13 mg/dL (ref 6–23)
CO2: 25 mEq/L (ref 19–32)
Calcium: 9.5 mg/dL (ref 8.4–10.5)
Chloride: 104 mEq/L (ref 96–112)
Creatinine, Ser: 1.15 mg/dL (ref 0.40–1.50)
GFR: 80.07 mL/min (ref >60.00)
Glucose, Bld: 103 mg/dL — ABNORMAL HIGH (ref 70–99)
Potassium: 4.2 mEq/L (ref 3.5–5.1)
Sodium: 138 mEq/L (ref 135–145)
Total Bilirubin: 0.6 mg/dL (ref 0.2–1.2)
Total Protein: 7.4 g/dL (ref 6.0–8.3)

## 2022-07-24 LAB — LIPID PANEL
Cholesterol: 177 mg/dL (ref 0–200)
HDL: 31.9 mg/dL — ABNORMAL LOW (ref >39.00)
NonHDL: 145.03
Total CHOL/HDL Ratio: 6
Triglycerides: 302 mg/dL — ABNORMAL HIGH (ref 0.0–149.0)
VLDL: 60.4 mg/dL — ABNORMAL HIGH (ref 0.0–40.0)

## 2022-07-24 LAB — LDL CHOLESTEROL, DIRECT: Direct LDL: 94 mg/dL

## 2022-07-24 MED ORDER — AMPHETAMINE-DEXTROAMPHET ER 30 MG PO CP24: 30.0000 mg | ORAL_CAPSULE | ORAL | 0 refills | Status: DC

## 2022-07-24 NOTE — Patient Instructions (Addendum)
Give [place] 2-3 business days to get the results of your labs back.   Keep the diet clean and stay active.  Consider CoQ10 for muscle aches.  I recommend getting the flu shot in mid October. This suggestion would change if the CDC comes out with a different recommendation.   Let us know if you need anything.

## 2022-07-24 NOTE — Progress Notes (Signed)
Chief Complaint  Patient presents with   Hyperlipidemia    No concerns since starting Pravachol.   ADHD    Pt reports improvement since starting new dose. Needs refill on Adderall    Devin Hoffman is 41 y.o. male here for ADHD follow up.  Patient is currently on Adderall XR 30 mg/d and compliance is excellent. Symptoms include: Well controlled. Side effects include: sweating of legs at night. Patient believes their dose should be unchanged. Denies tics, weight loss, difficulties with sleep, self-medication, alcohol/drug abuse, chest pain, or palpitations.  Hyperlipidemia Patient presents for dyslipidemia follow up. Currently being treated with pravastatin 10 mg/d and compliance with treatment thus far has been good. He  does have slight  myalgias. He is adhering to a healthy diet. Exercise: rowing, cycling,  The patient is not known to have coexisting coronary artery disease. No CP or SOB.   Past Medical History:  Diagnosis Date   Recurrent depression (HCC)     BP 110/80   Pulse 78   Temp 98.1 F (36.7 C)   Resp 14   Ht 6' (1.829 m)   Wt 225 lb 6.4 oz (102.2 kg)   BMI 30.57 kg/m  Gen- awake, alert, appearing stated age Heart- RRR Lungs- CTAB, no accessory muscle use Neuro- no facial tics Psych- age appropriate judgment and insight, normal mood and affect  Attention deficit hyperactivity disorder (ADHD), predominantly inattentive type - Plan: amphetamine-dextroamphetamine (ADDERALL XR) 30 MG 24 hr capsule, amphetamine-dextroamphetamine (ADDERALL XR) 30 MG 24 hr capsule, amphetamine-dextroamphetamine (ADDERALL XR) 30 MG 24 hr capsule  Hyperlipidemia, unspecified hyperlipidemia type - Plan: Comprehensive metabolic panel, Lipid panel  Chronic, stable. Cont Adderall XR 30 mg/d.  Chronic, stable. Cont pravastatin 10 mg/d. Monitor labs today. Counseled on diet/exercise. Pt voiced understanding and agreement to the plan.  Jilda Roche [place], DO 07/24/22 8:10  AM

## 2022-08-07 ENCOUNTER — Encounter: Payer: Self-pay | Admitting: Family Medicine

## 2022-08-07 ENCOUNTER — Other Ambulatory Visit: Payer: Self-pay | Admitting: Family Medicine

## 2022-08-07 DIAGNOSIS — E785 Hyperlipidemia, unspecified: Secondary | ICD-10-CM

## 2022-08-07 MED ORDER — FENOFIBRATE 48 MG PO TABS: 48.0000 mg | ORAL_TABLET | Freq: Every day | ORAL | 3 refills | Status: DC

## 2022-11-09 ENCOUNTER — Encounter: Payer: Self-pay | Admitting: Family Medicine

## 2022-11-09 ENCOUNTER — Ambulatory Visit (INDEPENDENT_AMBULATORY_CARE_PROVIDER_SITE_OTHER): Payer: PRIVATE HEALTH INSURANCE | Admitting: Family Medicine

## 2022-11-09 VITALS — BP 136/84 | HR 73 | Temp 98.2°F | Ht 72.0 in | Wt 223.5 lb

## 2022-11-09 DIAGNOSIS — R519 Headache, unspecified: Secondary | ICD-10-CM

## 2022-11-09 DIAGNOSIS — R030 Elevated blood-pressure reading, without diagnosis of hypertension: Secondary | ICD-10-CM

## 2022-11-09 MED ORDER — METHYLPREDNISOLONE ACETATE 80 MG/ML IJ SUSP
80.0000 mg | Freq: Once | INTRAMUSCULAR | Status: AC
  Administered 2022-11-09: 80 mg via INTRAMUSCULAR

## 2022-11-09 NOTE — Patient Instructions (Addendum)
Keep the diet clean and stay active.  See how things go with your symptoms and let me know if anything changes.   Check your blood pressures 2-3 times per week, alternating the time of day you check it. If it is high, considering waiting 1-2 minutes and rechecking. If it gets higher, your anxiety is likely creeping up and we should avoid rechecking.   Let us know if you need anything.

## 2022-11-09 NOTE — Progress Notes (Signed)
Chief Complaint  Patient presents with   Hypertension    Subjective: Patient is a 41 y.o. male here for elevated bp.  Over the past day, the patient is experiencing headaches, some dizziness, and elevated blood pressure.  He has not been sick recently denies any fevers.  No nausea, vomiting, diarrhea, abdominal pain, coughing, shortness of breath, sore throat, runny/stuffy nose, ear pain/drainage.  He has not changed his prescription medications and denies any illicit drug use.  He is eating and drinking normally though his appetite has been decreased since yesterday.  He does not take any blood pressure medicine and historically his blood pressure has been very well-controlled.  Of note, he took some CBD and it did lower his blood pressure.  Past Medical History:  Diagnosis Date   Recurrent depression (HCC)     Objective: BP 136/84 (BP Location: Left Arm, Cuff Size: Large)   Pulse 73   Temp 98.2 F (36.8 C) (Oral)   Ht 6' (1.829 m)   Wt 223 lb 8 oz (101.4 kg)   SpO2 98%   BMI 30.31 kg/m  General: Awake, appears stated age Heart: RRR, no LE edema Lungs: CTAB, no rales, wheezes or rhonchi. No accessory muscle use Abdomen: Bowel sounds present, soft, nontender, nondistended Neuro: Gait is normal, DTRs equal and symmetric throughout, no clonus, no cerebellar signs, 5/5 strength throughout HEENT: Ears are patent without otorrhea, TMs negative bilaterally, nares are patent without rhinorrhea, MMM, PERRLA Psych: Age appropriate judgment and insight, normal affect and mood  Assessment and Plan: Elevated blood pressure reading  Acute nonintractable headache, unspecified headache type - Plan: methylPREDNISolone acetate (DEPO-MEDROL) injection 80 mg  Could be a viral infection causing the blood pressure spike.  I would not start an antihypertensive medication at this time.  Counseled on diet and exercise.  Stay hydrated.  We will see how a Depo-Medrol injection helps with the symptoms and  if this also helps with his blood pressure.  With CBD helping his blood pressure, I am questioning whether there is a psychiatric component.  We will see how he does in the coming weeks. The patient voiced understanding and agreement to the plan.  Jilda Roche [place], DO 11/09/22  5:01 PM

## 2022-12-03 ENCOUNTER — Ambulatory Visit (INDEPENDENT_AMBULATORY_CARE_PROVIDER_SITE_OTHER): Payer: PRIVATE HEALTH INSURANCE | Admitting: Sports Medicine

## 2022-12-03 VITALS — BP 118/82 | HR 77 | Ht 72.0 in | Wt 229.0 lb

## 2022-12-03 DIAGNOSIS — M5442 Lumbago with sciatica, left side: Secondary | ICD-10-CM

## 2022-12-03 DIAGNOSIS — G8929 Other chronic pain: Secondary | ICD-10-CM | POA: Diagnosis not present

## 2022-12-03 MED ORDER — MELOXICAM 15 MG PO TABS: 15.0000 mg | ORAL_TABLET | Freq: Every day | ORAL | 0 refills | Status: DC

## 2022-12-03 MED ORDER — METHYLPREDNISOLONE ACETATE 80 MG/ML IJ SUSP
80.0000 mg | Freq: Once | INTRAMUSCULAR | Status: AC
  Administered 2022-12-03: 80 mg via INTRAMUSCULAR

## 2022-12-03 MED ORDER — KETOROLAC TROMETHAMINE 60 MG/2ML IM SOLN
60.0000 mg | Freq: Once | INTRAMUSCULAR | Status: AC
  Administered 2022-12-03: 60 mg via INTRAMUSCULAR

## 2022-12-03 NOTE — Patient Instructions (Addendum)
Good to see you  Low back HEP  - Start meloxicam 15 mg daily x2 weeks.  If still having pain after 2 weeks, complete 3rd-week of meloxicam. May use remaining meloxicam as needed once daily for pain control.  Do not to use additional NSAIDs while taking meloxicam.  May use Tylenol 500-1000 mg 2 to 3 times a day for breakthrough pain. 3-4 week follow up  

## 2022-12-03 NOTE — Addendum Note (Signed)
Addended by: Douglass Rivers T on: 12/03/2022 01:20 PM   Modules accepted: Orders

## 2022-12-03 NOTE — Progress Notes (Signed)
[name].[facility] [place] Phone: [phone]   Assessment and Plan:    1. Chronic left-sided low back pain with left-sided sciatica -Chronic with exacerbation, initial sports medicine visit - Consistent with left-sided sciatica, likely of lumbar origin based on HPI, physical exam - Start meloxicam 15 mg daily x2 weeks.  If still having pain after 2 weeks, complete 3rd-week of meloxicam. May use remaining meloxicam as needed once daily for pain control.  Do not to use additional NSAIDs while taking meloxicam.  May use Tylenol [phone] mg 2 to 3 times a day for breakthrough pain. - May continue gabapentin 100 mg nightly as needed for radicular pain.  Patient states that 100 mg makes him significantly drowsy, so cannot likely tolerate a dose during the day or a larger dose - Patient elected for IM injection of methylprednisone 80 mg/Toradol 60 mg.  Injection given in clinic today and tolerated well. -Start HEP for low back and sciatica  Other orders - meloxicam (MOBIC) 15 MG tablet; Take 1 tablet (15 mg total) by mouth daily.    Pertinent previous records reviewed include lumbar x-ray 2021   Follow Up: 3 to 4 weeks for reevaluation.  If no improvement or worsening of symptoms, would obtain repeat lumbar x-ray and could consider OMT versus physical therapy.  Ultimately if no improvement with 6 weeks of conservative therapy, would proceed with lumbar MRI   Subjective:   I, Devin Hoffman, am serving as a Education administrator for Doctor Glennon Mac  Chief Complaint: low back pain   HPI:   12/03/22 Patient is a 42 year old male complaining of low back pain. Patient states that he is having sciatic nerve pain , been going on for 6 months ish, left side pain radiates down to his ankle, no meds for the pain   Relevant Historical Information: None pertinent  Additional pertinent review of systems negative.   Current  Outpatient Medications:    amphetamine-dextroamphetamine (ADDERALL XR) 30 MG 24 hr capsule, Take 1 capsule (30 mg total) by mouth every morning., Disp: 30 capsule, Rfl: 0   amphetamine-dextroamphetamine (ADDERALL XR) 30 MG 24 hr capsule, Take 1 capsule (30 mg total) by mouth every morning., Disp: 30 capsule, Rfl: 0   amphetamine-dextroamphetamine (ADDERALL XR) 30 MG 24 hr capsule, Take 1 capsule (30 mg total) by mouth every morning., Disp: 30 capsule, Rfl: 0   FLUoxetine (PROZAC) 20 MG tablet, Take 1 tablet (20 mg total) by mouth daily., Disp: 90 tablet, Rfl: 2   meloxicam (MOBIC) 15 MG tablet, Take 1 tablet (15 mg total) by mouth daily., Disp: 30 tablet, Rfl: 0   Objective:     Vitals:   12/03/22 1248  BP: 118/82  Pulse: 77  SpO2: 99%  Weight: 229 lb (103.9 kg)  Height: 6' (1.829 m)      Body mass index is 31.06 kg/m.    Physical Exam:    Gen: Appears well, nad, nontoxic and pleasant Psych: Alert and oriented, appropriate mood and affect Neuro: sensation intact, strength is 5/5 in upper and lower extremities, muscle tone wnl Skin: no susupicious lesions or rashes  Back - Normal skin, Spine with normal alignment and no deformity.   No tenderness to vertebral process palpation.     NTTP right gluteal musculature TTP left gluteal musculature, left lumbar paraspinal Straight leg raise positive left, negative right Trendelenberg positive left Piriformis Test negative    Electronically signed by:  Aleen Sells D.[name] 1:10 PM 12/03/22

## 2022-12-04 ENCOUNTER — Ambulatory Visit: Payer: PRIVATE HEALTH INSURANCE | Admitting: Sports Medicine

## 2023-01-22 ENCOUNTER — Encounter: Payer: Self-pay | Admitting: Family Medicine

## 2023-01-22 ENCOUNTER — Ambulatory Visit (INDEPENDENT_AMBULATORY_CARE_PROVIDER_SITE_OTHER): Payer: PRIVATE HEALTH INSURANCE | Admitting: Family Medicine

## 2023-01-22 VITALS — BP 108/72 | HR 93 | Temp 98.3°F | Ht 72.0 in | Wt 226.0 lb

## 2023-01-22 DIAGNOSIS — E785 Hyperlipidemia, unspecified: Secondary | ICD-10-CM

## 2023-01-22 DIAGNOSIS — Z125 Encounter for screening for malignant neoplasm of prostate: Secondary | ICD-10-CM | POA: Diagnosis not present

## 2023-01-22 DIAGNOSIS — Z0000 Encounter for general adult medical examination without abnormal findings: Secondary | ICD-10-CM

## 2023-01-22 DIAGNOSIS — Z789 Other specified health status: Secondary | ICD-10-CM

## 2023-01-22 LAB — CBC
HCT: 41.2 % (ref 39.0–52.0)
Hemoglobin: 14.2 g/dL (ref 13.0–17.0)
MCHC: 34.4 g/dL (ref 30.0–36.0)
MCV: 92.8 fl (ref 78.0–100.0)
Platelets: 247 10*3/uL (ref 150.0–400.0)
RBC: 4.44 Mil/uL (ref 4.22–5.81)
RDW: 13.6 % (ref 11.5–15.5)
WBC: 5.6 10*3/uL (ref 4.0–10.5)

## 2023-01-22 LAB — LIPID PANEL
Cholesterol: 182 mg/dL (ref 0–200)
HDL: 37.3 mg/dL — ABNORMAL LOW (ref >39.00)
NonHDL: 145.11
Total CHOL/HDL Ratio: 5
Triglycerides: 278 mg/dL — ABNORMAL HIGH (ref 0.0–149.0)
VLDL: 55.6 mg/dL — ABNORMAL HIGH (ref 0.0–40.0)

## 2023-01-22 LAB — COMPREHENSIVE METABOLIC PANEL
ALT: 27 U/L (ref 0–53)
AST: 16 U/L (ref 0–37)
Albumin: 4.4 g/dL (ref 3.5–5.2)
Alkaline Phosphatase: 59 U/L (ref 39–117)
BUN: 16 mg/dL (ref 6–23)
CO2: 29 mEq/L (ref 19–32)
Calcium: 9.8 mg/dL (ref 8.4–10.5)
Chloride: 102 mEq/L (ref 96–112)
Creatinine, Ser: 1.03 mg/dL (ref 0.40–1.50)
GFR: 91.07 mL/min (ref >60.00)
Glucose, Bld: 87 mg/dL (ref 70–99)
Potassium: 4.5 mEq/L (ref 3.5–5.1)
Sodium: 138 mEq/L (ref 135–145)
Total Bilirubin: 0.6 mg/dL (ref 0.2–1.2)
Total Protein: 7.2 g/dL (ref 6.0–8.3)

## 2023-01-22 LAB — LDL CHOLESTEROL, DIRECT: Direct LDL: 103 mg/dL

## 2023-01-22 LAB — PSA: PSA: 2.23 ng/mL (ref 0.10–4.00)

## 2023-01-22 MED ORDER — AMPHETAMINE-DEXTROAMPHET ER 20 MG PO CP24: 20.0000 mg | ORAL_CAPSULE | ORAL | 0 refills | Status: DC

## 2023-01-22 NOTE — Patient Instructions (Addendum)
Give [place] 2-3 business days to get the results of your labs back.   Keep the diet clean and stay active.  Please get me a copy of your advanced directive form at your convenience.  Someone will reach out regarding your heart scan.    Let us know if you need anything.

## 2023-01-22 NOTE — Progress Notes (Signed)
Chief Complaint  Patient presents with   Annual Exam    Well Male Devin Hoffman is here for a complete physical.   His last physical was >1 year ago.  Current diet: in general, a "decent" diet.   Current exercise: none Weight trend: stable Fatigue out of ordinary? No. Getting better after covid.  Seat belt? Yes.   Advanced directive? No  Health maintenance Tetanus- Yes HIV- Yes Hep C- Yes  Past Medical History:  Diagnosis Date   Recurrent depression ([place])      Past Surgical History:  Procedure Laterality Date   HAND SURGERY Left    KNEE SURGERY      Medications  Current Outpatient Medications on File Prior to Visit  Medication Sig Dispense Refill   meloxicam (MOBIC) 15 MG tablet Take 1 tablet (15 mg total) by mouth daily. 30 tablet 0    Allergies No Known Allergies  Family History Family History  Problem Relation Age of Onset   Alcohol abuse Father    Autism spectrum disorder Brother    Colon cancer Maternal Grandmother    Pancreatic cancer Paternal Grandfather     Review of Systems: Constitutional: no fevers or chills Eye:  no recent significant change in vision Ear/Nose/Mouth/Throat:  Ears:  no hearing loss Nose/Mouth/Throat:  no complaints of nasal congestion, no sore throat Cardiovascular:  no chest pain Respiratory:  no shortness of breath Gastrointestinal:  no abdominal pain, no change in bowel habits GU:  Male: negative for dysuria, frequency, and incontinence Musculoskeletal/Extremities:  no pain of the joints Integumentary (Skin/Breast):  no abnormal skin lesions reported Neurologic:  no headaches Endocrine: No unexpected weight changes Hematologic/Lymphatic:  no night sweats  Exam BP 108/72 (BP Location: Left Arm, Patient Position: Sitting, Cuff Size: Large)   Pulse 93   Temp 98.3 F (36.8 C) (Oral)   Ht 6' (1.829 m)   Wt 226 lb (102.5 kg)   SpO2 97%   BMI 30.65 kg/m  General:  well developed, well nourished, in no apparent  distress Skin:  no significant moles, warts, or growths Head:  no masses, lesions, or tenderness Eyes:  pupils equal and round, sclera anicteric without injection Ears:  canals without lesions, TMs shiny without retraction, no obvious effusion, no erythema Nose:  nares patent, mucosa normal Throat/Pharynx:  lips and gingiva without lesion; tongue and uvula midline; non-inflamed pharynx; no exudates or postnasal drainage Neck: neck supple without adenopathy, thyromegaly, or masses Lungs:  clear to auscultation, breath sounds equal bilaterally, no respiratory distress Cardio:  regular rate and rhythm, no bruits, no LE edema Abdomen:  abdomen soft, nontender; bowel sounds normal; no masses or organomegaly Rectal: Deferred Musculoskeletal:  symmetrical muscle groups noted without atrophy or deformity Extremities:  no clubbing, cyanosis, or edema, no deformities, no skin discoloration Neuro:  gait normal; deep tendon reflexes normal and symmetric Psych: well oriented with normal range of affect and appropriate judgment/insight  Assessment and Plan  Well adult exam - Plan: CBC, Comprehensive metabolic panel, Lipid panel  Hyperlipidemia, unspecified hyperlipidemia type - Plan: CT CARDIAC SCORING (SELF PAY ONLY)  Screening for prostate cancer - Plan: PSA  Statin intolerance   Well 42 y.o. male. Counseled on diet and exercise. Counseled on risks and benefits of prostate cancer screening with PSA. The patient agrees to undergo screening.  Will order CACS after discussing his cholesterol and statin intolerance.  Other orders as above. Advanced directive form provided today.  Follow up in 6 mo pending the above workup. The  patient voiced understanding and agreement to the plan.  [place], DO 01/22/23 9:08 AM

## 2023-01-30 ENCOUNTER — Ambulatory Visit (HOSPITAL_BASED_OUTPATIENT_CLINIC_OR_DEPARTMENT_OTHER)
Admission: RE | Admit: 2023-01-30 | Discharge: 2023-01-30 | Disposition: A | Discharge status: Home / Self Care | Payer: PRIVATE HEALTH INSURANCE | Source: Ambulatory Visit | Attending: Family Medicine | Admitting: Family Medicine

## 2023-01-30 DIAGNOSIS — E785 Hyperlipidemia, unspecified: Secondary | ICD-10-CM | POA: Insufficient documentation

## 2023-02-01 ENCOUNTER — Other Ambulatory Visit: Payer: Self-pay | Admitting: Family Medicine

## 2023-02-01 ENCOUNTER — Encounter: Payer: Self-pay | Admitting: Family Medicine

## 2023-02-01 DIAGNOSIS — E785 Hyperlipidemia, unspecified: Secondary | ICD-10-CM

## 2023-02-01 MED ORDER — EZETIMIBE 10 MG PO TABS: 10.0000 mg | ORAL_TABLET | Freq: Every day | ORAL | 3 refills | Status: DC

## 2023-03-07 ENCOUNTER — Ambulatory Visit: Payer: 59 | Admitting: Family Medicine

## 2023-03-07 VITALS — BP 126/85 | HR 92 | Temp 98.3°F | Ht 72.0 in | Wt 225.0 lb

## 2023-03-07 DIAGNOSIS — T7840XA Allergy, unspecified, initial encounter: Secondary | ICD-10-CM

## 2023-03-07 LAB — POC COVID19 BINAXNOW: SARS Coronavirus 2 Ag: NEGATIVE

## 2023-03-07 MED ORDER — PREDNISONE 20 MG PO TABS: 40.0000 mg | ORAL_TABLET | Freq: Every day | ORAL | 0 refills | Status: AC

## 2023-03-07 MED ORDER — ALBUTEROL SULFATE HFA 108 (90 BASE) MCG/ACT IN AERS: 2.0000 | INHALATION_SPRAY | Freq: Four times a day (QID) | RESPIRATORY_TRACT | 0 refills | Status: DC | PRN

## 2023-03-07 MED ORDER — EPINEPHRINE 0.3 MG/0.3ML IJ SOAJ: 0.3000 mg | INTRAMUSCULAR | 1 refills | Status: AC | PRN

## 2023-03-07 NOTE — Patient Instructions (Addendum)
COVID negative Take your Zyrtec daily. Avoid known triggers. Adding a short prednisone burst  Adding as needed albuterol for shortness of breath and wheezing Given that each exposure caused a more intense reaction, will provide Epi Pen for emergency use - instructions provided.  Referral to allergist for testing.

## 2023-03-07 NOTE — Progress Notes (Signed)
Acute Office Visit  Subjective:     Patient ID: Devin Hoffman, male    DOB: November 21, 1981, 42 y.o.   MRN: [id number]  Chief Complaint  Patient presents with   Nasal Congestion    Cough, low grade fever    HPI Patient is in today for nasal congestion, cough.  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 He went back Tuesday morning and within 10 minutes symptoms resumed - throat itching, congestion, sneezing, rhinorrhea. Tuesday night he went back again and as soon as he walked in he started with dry cough and felt throat was a little swollen. He left within 5 minutes of being there due to symptoms worsening. Started feeling worse during the night. Temp was 99.68F, throat felt raw and swollen, he felt like he was having a hard time taking deep breath, like the air was really thick. He did not go back at all yesterday. Today is the best he's felt, still feeling some throat irritation/rawness, cough, congestion. This morning he coughed up some bright green sputum.   He has not had any sick contacts. He has not taken a COVID test. Breathing is improved, but still not to baseline. O2 sats have been good, but it still feels hard to breathe, still having dry coughing fits, and itchy/watery eyes.   States he has not been around cats much in the past  Reports he has some mild seasonal allergies and maybe a bee sting allergy, but has never had an anaphylaxis type reaction to anything.    ROS All review of systems negative except what is listed in the HPI      Objective:    BP 126/85   Pulse 92   Temp 98.3 F (36.8 C) (Oral)   Ht 6' (1.829 m)   Wt 225 lb (102.1 kg)   SpO2 96%   BMI 30.52 kg/m    Physical Exam Vitals reviewed.  Constitutional:      Appearance: Normal appearance.  HENT:     Head: Normocephalic and atraumatic.     Nose: Congestion present.     Mouth/Throat:     Mouth: Mucous membranes are  moist.     Pharynx: Oropharynx is clear. No oropharyngeal exudate or posterior oropharyngeal erythema.  Eyes:     Comments: Watery eyes  Cardiovascular:     Rate and Rhythm: Normal rate and regular rhythm.     Pulses: Normal pulses.     Heart sounds: Normal heart sounds.  Pulmonary:     Effort: Pulmonary effort is normal.     Breath sounds: Normal breath sounds.  Musculoskeletal:     Cervical back: Normal range of motion and neck supple. No tenderness.  Lymphadenopathy:     Cervical: No cervical adenopathy.  Skin:    General: Skin is warm and dry.  Neurological:     Mental Status: He is alert and oriented to person, place, and time.  Psychiatric:        Mood and Affect: Mood normal.        Behavior: Behavior normal.        Thought Content: Thought content normal.        Judgment: Judgment normal.     No results found for any visits on 03/07/23.      Assessment & Plan:   Problem List Items Addressed This Visit   None Visit Diagnoses     Allergic reaction, initial encounter    -  Primary   Relevant Medications   albuterol (VENTOLIN HFA) 108 (90 Base) MCG/ACT inhaler   predniSONE (DELTASONE) 20 MG tablet   EPINEPHrine (EPIPEN 2-PAK) 0.3 mg/0.3 mL IJ SOAJ injection   Other Relevant Orders   Ambulatory referral to Allergy   POC COVID-19 BinaxNow     COVID negative Take your Zyrtec daily. Avoid known triggers. Adding a short prednisone burst  Adding as needed albuterol for shortness of breath and wheezing Given that each exposure caused a more intense reaction, will provide Epi Pen for emergency use - instructions provided.  Referral to allergist for testing.   Meds ordered this encounter  Medications   albuterol (VENTOLIN HFA) 108 (90 Base) MCG/ACT inhaler    Sig: Inhale 2 puffs into the lungs every 6 (six) hours as needed for wheezing or shortness of breath.    Dispense:  8 g    Refill:  0    Order Specific Question:   Supervising Provider    Answer:   Danise Edge A [[id number]]   predniSONE (DELTASONE) 20 MG tablet    Sig: Take 2 tablets (40 mg total) by mouth daily with breakfast for 5 days.    Dispense:  10 tablet    Refill:  0    Order Specific Question:   Supervising Provider    Answer:   Danise Edge A [[id number]]   EPINEPHrine (EPIPEN 2-PAK) 0.3 mg/0.3 mL IJ SOAJ injection    Sig: Inject 0.3 mg into the muscle as needed for anaphylaxis.    Dispense:  1 each    Refill:  1    Order Specific Question:   Supervising Provider    Answer:   Danise Edge A [[id number]]    Return if symptoms worsen or fail to improve.  Clayborne Dana, NP

## 2023-03-11 ENCOUNTER — Encounter: Payer: Self-pay | Admitting: Family Medicine

## 2023-03-12 ENCOUNTER — Other Ambulatory Visit: Payer: Self-pay | Admitting: Family Medicine

## 2023-03-12 DIAGNOSIS — R5383 Other fatigue: Secondary | ICD-10-CM

## 2023-03-15 ENCOUNTER — Encounter: Payer: Self-pay | Admitting: Family Medicine

## 2023-03-15 ENCOUNTER — Other Ambulatory Visit (INDEPENDENT_AMBULATORY_CARE_PROVIDER_SITE_OTHER): Payer: 59

## 2023-03-15 ENCOUNTER — Other Ambulatory Visit: Payer: Self-pay | Admitting: Family Medicine

## 2023-03-15 ENCOUNTER — Ambulatory Visit: Payer: 59 | Admitting: Family Medicine

## 2023-03-15 DIAGNOSIS — R5383 Other fatigue: Secondary | ICD-10-CM

## 2023-03-15 DIAGNOSIS — E785 Hyperlipidemia, unspecified: Secondary | ICD-10-CM | POA: Diagnosis not present

## 2023-03-15 DIAGNOSIS — R7989 Other specified abnormal findings of blood chemistry: Secondary | ICD-10-CM

## 2023-03-15 LAB — LIPID PANEL
Cholesterol: 176 mg/dL (ref 0–200)
HDL: 30.7 mg/dL — ABNORMAL LOW (ref >39.00)
Total CHOL/HDL Ratio: 6
Triglycerides: 408 mg/dL — ABNORMAL HIGH (ref 0.0–149.0)

## 2023-03-15 LAB — HEPATIC FUNCTION PANEL
ALT: 39 U/L (ref 0–53)
AST: 22 U/L (ref 0–37)
Albumin: 4.3 g/dL (ref 3.5–5.2)
Alkaline Phosphatase: 55 U/L (ref 39–117)
Bilirubin, Direct: 0.1 mg/dL (ref 0.0–0.3)
Total Bilirubin: 0.6 mg/dL (ref 0.2–1.2)
Total Protein: 6.8 g/dL (ref 6.0–8.3)

## 2023-03-15 LAB — TESTOSTERONE: Testosterone: 208.53 ng/dL — ABNORMAL LOW (ref 300.00–890.00)

## 2023-03-15 LAB — LDL CHOLESTEROL, DIRECT: Direct LDL: 79 mg/dL

## 2023-03-29 ENCOUNTER — Other Ambulatory Visit (INDEPENDENT_AMBULATORY_CARE_PROVIDER_SITE_OTHER): Payer: 59

## 2023-03-29 DIAGNOSIS — R7989 Other specified abnormal findings of blood chemistry: Secondary | ICD-10-CM | POA: Diagnosis not present

## 2023-04-02 ENCOUNTER — Encounter: Payer: Self-pay | Admitting: Family Medicine

## 2023-04-02 LAB — TESTOSTERONE, FREE: TESTOSTERONE FREE: 75.6 pg/mL (ref 46.0–224.0)

## 2023-04-03 ENCOUNTER — Ambulatory Visit: Payer: Self-pay | Admitting: Internal Medicine

## 2023-04-26 NOTE — Progress Notes (Unsigned)
[name] [address] [place] [address] Phone: [phone] Subjective:   [name], am serving as a scribe for Dr. Antoine Primas.  I'm seeing this patient by the request  of:  [name], DO  CC: Bilateral knee pain  [id number]  Devin Hoffman is a 42 y.o. male coming in with complaint of B knee pain. Last seen in 2021 for OMT. Patient states here for knee bilateral knee injection. Has gotten worse the last couple of weeks.  Patient has had difficulty with his knees for quite some time.  Patient last time was seen by another provider with Dr. Denyse Amass with viscosupplementation.  States that that was painful and was doing much better.  Now having worsening discomfort again.  Patient states that it hurts with any type of activity more than regular daily activities.  Medial aspect of the knees.  Denies any instability      Past Medical History:  Diagnosis Date   Recurrent depression (HCC)    Past Surgical History:  Procedure Laterality Date   HAND SURGERY Left    KNEE SURGERY     Social History   Socioeconomic History   Marital status: Married    Spouse name: Not on file   Number of children: Not on file   Years of education: Not on file   Highest education level: Associate degree: occupational, Scientist, product/process development, or vocational program  Occupational History   Not on file  Tobacco Use   Smoking status: Former    Types: Cigarettes    Quit date: 04/05/2013    Years since quitting: 10.0   Smokeless tobacco: Never  Substance and Sexual Activity   Alcohol use: No   Drug use: No   Sexual activity: Yes    Partners: Female    Birth control/protection: None  Other Topics Concern   Not on file  Social History Narrative   Not on file   Social Determinants of Health   Financial Resource Strain: Low Risk  (03/07/2023)   Overall Financial Resource Strain (CARDIA)    Difficulty of Paying Living Expenses: Not hard at all  Food  Insecurity: No Food Insecurity (03/07/2023)   Hunger Vital Sign    Worried About Running Out of Food in the Last Year: Never true    Ran Out of Food in the Last Year: Never true  Transportation Needs: No Transportation Needs (03/07/2023)   PRAPARE - Administrator, Civil Service (Medical): No    Lack of Transportation (Non-Medical): No  Physical Activity: Insufficiently Active (03/07/2023)   Exercise Vital Sign    Days of Exercise per Week: 3 days    Minutes of Exercise per Session: 30 min  Stress: Stress Concern Present (03/07/2023)   [employer] of Occupational Health - Occupational Stress Questionnaire    Feeling of Stress : To some extent  Social Connections: Moderately Isolated (03/07/2023)   Social Connection and Isolation Panel [NHANES]    Frequency of Communication with Friends and Family: More than three times a week    Frequency of Social Gatherings with Friends and Family: Once a week    Attends Religious Services: Never    Database administrator or Organizations: No    Attends Engineer, structural: Not on file    Marital Status: Married   No Known Allergies Family History  Problem Relation Age of Onset   Alcohol abuse Father    Autism spectrum disorder Brother  Colon cancer Maternal Grandmother    Pancreatic cancer Paternal Grandfather      Current Outpatient Medications (Cardiovascular):    EPINEPHrine (EPIPEN 2-PAK) 0.3 mg/0.3 mL IJ SOAJ injection, Inject 0.3 mg into the muscle as needed for anaphylaxis.   ezetimibe (ZETIA) 10 MG tablet, Take 1 tablet (10 mg total) by mouth daily.  Current Outpatient Medications (Respiratory):    albuterol (VENTOLIN HFA) 108 (90 Base) MCG/ACT inhaler, Inhale 2 puffs into the lungs every 6 (six) hours as needed for wheezing or shortness of breath.  Current Outpatient Medications (Analgesics):    meloxicam (MOBIC) 15 MG tablet, Take 1 tablet (15 mg total) by mouth daily.   Current Outpatient Medications  (Other):    amphetamine-dextroamphetamine (ADDERALL XR) 20 MG 24 hr capsule, Take 1 capsule (20 mg total) by mouth every morning.   amphetamine-dextroamphetamine (ADDERALL XR) 20 MG 24 hr capsule, Take 1 capsule (20 mg total) by mouth every morning.   amphetamine-dextroamphetamine (ADDERALL XR) 20 MG 24 hr capsule, Take 1 capsule (20 mg total) by mouth every morning.   Reviewed prior external information including notes and imaging from  primary care provider As well as notes that were available from care everywhere and other healthcare systems.  Past medical history, social, surgical and family history all reviewed in electronic medical record.  No pertanent information unless stated regarding to the chief complaint.   Review of Systems:  No headache, visual changes, nausea, vomiting, diarrhea, constipation, dizziness, abdominal pain, skin rash, fevers, chills, night sweats, weight loss, swollen lymph nodes, body aches, chest pain, shortness of breath, mood changes. POSITIVE muscle aches, joint swelling  Objective  Blood pressure 118/82, pulse 87, height 6' (1.829 m), weight 232 lb (105.2 kg), SpO2 98 %.   General: No apparent distress alert and oriented x3 mood and affect normal, dressed appropriately.  HEENT: Pupils equal, extraocular movements intact  Respiratory: Patient's speak in full sentences and does not appear short of breath  Cardiovascular: No lower extremity edema, non tender, no erythema  Bilateral knees do have a small effusion bilaterally.  Mild crepitus.  Tender to palpation of the medial joint line bilaterally right greater than left.  No significant instability noted on exam today.  After informed written and verbal consent, patient was seated on exam table. Right knee was prepped with alcohol swab and utilizing anterolateral approach, patient's right knee space was injected with 4:1  marcaine 0.5%: Kenalog 40mg /dL. Patient tolerated the procedure well without immediate  complications.  After informed written and verbal consent, patient was seated on exam table. Left knee was prepped with alcohol swab and utilizing anterolateral approach, patient's left knee space was injected with 4:1  marcaine 0.5%: Kenalog 40mg /dL. Patient tolerated the procedure well without immediate complications.    Impression and Recommendations:     The above documentation has been reviewed and is accurate and complete Judi Saa, DO

## 2023-04-29 ENCOUNTER — Encounter: Payer: Self-pay | Admitting: Family Medicine

## 2023-04-29 ENCOUNTER — Ambulatory Visit (INDEPENDENT_AMBULATORY_CARE_PROVIDER_SITE_OTHER): Payer: 59 | Admitting: Family Medicine

## 2023-04-29 VITALS — BP 118/82 | HR 87 | Ht 72.0 in | Wt 232.0 lb

## 2023-04-29 DIAGNOSIS — M25561 Pain in right knee: Secondary | ICD-10-CM

## 2023-04-29 DIAGNOSIS — M25562 Pain in left knee: Secondary | ICD-10-CM | POA: Diagnosis not present

## 2023-04-29 DIAGNOSIS — G8929 Other chronic pain: Secondary | ICD-10-CM | POA: Diagnosis not present

## 2023-04-29 NOTE — Assessment & Plan Note (Signed)
Given bilateral injections.  Discussed icing regimen and home exercises, which activities to do and which ones to avoid.  Discussed posture and ergonomics otherwise.  Discussed with patient that viscosupplementation could be beneficial and we will see if we can get approval for this as well.  Could consider the possibility of oral anti-inflammatories if needed.  May need advanced imaging if this continues to give him difficulty.  Follow-up with me again 6 to 8 weeks

## 2023-04-29 NOTE — Patient Instructions (Addendum)
Injection in knees today See you again in 2-3 months

## 2023-05-08 ENCOUNTER — Ambulatory Visit: Payer: 59 | Admitting: Family Medicine

## 2023-05-08 ENCOUNTER — Encounter: Payer: Self-pay | Admitting: Family Medicine

## 2023-05-08 VITALS — BP 109/69 | HR 72 | Temp 98.5°F | Ht 72.0 in | Wt 225.4 lb

## 2023-05-08 DIAGNOSIS — M25522 Pain in left elbow: Secondary | ICD-10-CM

## 2023-05-08 DIAGNOSIS — M25521 Pain in right elbow: Secondary | ICD-10-CM | POA: Diagnosis not present

## 2023-05-08 DIAGNOSIS — E782 Mixed hyperlipidemia: Secondary | ICD-10-CM | POA: Diagnosis not present

## 2023-05-08 DIAGNOSIS — M79642 Pain in left hand: Secondary | ICD-10-CM

## 2023-05-08 DIAGNOSIS — M79641 Pain in right hand: Secondary | ICD-10-CM

## 2023-05-08 LAB — LIPID PANEL
Cholesterol: 178 mg/dL (ref 0–200)
HDL: 31.8 mg/dL — ABNORMAL LOW (ref >39.00)
LDL Cholesterol: 108 mg/dL — ABNORMAL HIGH (ref 0–99)
NonHDL: 146.1
Total CHOL/HDL Ratio: 6
Triglycerides: 191 mg/dL — ABNORMAL HIGH (ref 0.0–149.0)
VLDL: 38.2 mg/dL (ref 0.0–40.0)

## 2023-05-08 LAB — CBC
HCT: 42.2 % (ref 39.0–52.0)
Hemoglobin: 14.2 g/dL (ref 13.0–17.0)
MCHC: 33.7 g/dL (ref 30.0–36.0)
MCV: 92 fl (ref 78.0–100.0)
Platelets: 239 10*3/uL (ref 150.0–400.0)
RBC: 4.59 Mil/uL (ref 4.22–5.81)
RDW: 13.3 % (ref 11.5–15.5)
WBC: 5.3 10*3/uL (ref 4.0–10.5)

## 2023-05-08 LAB — URIC ACID: Uric Acid, Serum: 8.4 mg/dL — ABNORMAL HIGH (ref 4.0–7.8)

## 2023-05-08 NOTE — Progress Notes (Signed)
Musculoskeletal Exam  Patient: Devin Hoffman DOB: September 25, 1981  DOS: 05/08/2023  SUBJECTIVE:  Chief Complaint:   Chief Complaint  Patient presents with   Joint Pain    [place] Fant is a 42 y.o.  male for evaluation and treatment of elbow/hand pain.   Onset:  several months ago. No inj or change in activity.  Location: hands and elbows Character:  aching  Progression of issue:  is unchanged Associated symptoms: no swelling, redness, bruising, decreased ROM Treatment: to date has been ice and acupuncture, CBD oil.   + Family history of gout He does not play golf or tenderness. No known family history of autoimmune diseases otherwise. He has been off of his statins and Zetia for at least 2 months. Neurovascular symptoms: no  Patient has a history of elevated cholesterol.  He has failed most recently Zetia due to myalgias.  He is also done poorly with Tricor, atorvastatin, rosuvastatin, and pravastatin.  He had a coronary artery calcium score showing a score of 0, 3 months ago.  No chest pain or shortness of breath.  Diet is improving.  He is trying to cut sugar out of his diet.  No chest pain or shortness of breath.  Past Medical History:  Diagnosis Date   Recurrent depression (HCC)     Objective: VITAL SIGNS: BP 109/69 (BP Location: Right Arm, Patient Position: Sitting, Cuff Size: Large)   Pulse 72   Temp 98.5 F (36.9 C) (Oral)   Ht 6' (1.829 m)   Wt 225 lb 6 oz (102.2 kg)   SpO2 98%   BMI 30.57 kg/m  Constitutional: Well formed, well developed. No acute distress. Thorax & Lungs: No accessory muscle use Musculoskeletal: Elbows/wrists.   Normal active range of motion: yes.   Normal passive range of motion: yes Tenderness to palpation: Yes over the medial dorsum of both wrists pain over the medial epicondyle of both elbows. Deformity: no Ecchymosis: no No pain with resisted wrist flexion Neurologic: Normal sensory function. No focal deficits noted. DTR's equal  and symmetric in LE's. No clonus. Psychiatric: Normal mood. Age appropriate judgment and insight. Alert & oriented x 3.    Assessment:  Mixed hyperlipidemia - Plan: Lipid panel  Bilateral elbow joint pain - Plan: CBC, Aldolase, Uric acid, Rheumatoid Factor, Cyclic citrul peptide antibody, IgG ([facility]), Anti-Smith antibody, Anti-DNA antibody, double-stranded, Sjogrens syndrome-A extractable nuclear antibody, Sjogrens syndrome-B extractable nuclear antibody  Bilateral hand pain - Plan: CBC, Aldolase, Uric acid, Rheumatoid Factor, Cyclic citrul peptide antibody, IgG ([facility]), Anti-Smith antibody, Anti-DNA antibody, double-stranded, Sjogrens syndrome-A extractable nuclear antibody, Sjogrens syndrome-B extractable nuclear antibody  Plan: CAC score 0 as of 01/30/2023.  Has failed several statins and possibly even Zetia.  Diet is reportedly improved and he is exercising routinely.  If still elevated, will refer to the lipid team.   2/3. Stretches/exercises for the wrist and tennis elbow provided, heat, ice, Tylenol.  Check uric acid and autoimmune labs.  Foods with lower information provided in paperwork as well. F/u prn/pending above. The patient voiced understanding and agreement to the plan.  I spent 30 minutes with the patient discussing above plans in addition to reviewing his chart and the same day of the visit.   Jilda Roche [place], DO 05/08/23  12:11 PM

## 2023-05-08 NOTE — Patient Instructions (Addendum)
Give [place] 5-6 business days to get the results of your labs back.   Ice/cold pack over area for 10-15 min twice daily.  OK to take Tylenol 1000 mg (2 extra strength tabs) or 975 mg (3 regular strength tabs) every 6 hours as needed.  Let us know if you need anything.  Foods that may reduce pain: 1) Ginger 2) Blueberries 3) Salmon 4) Pumpkin seeds 5) dark chocolate 6) turmeric 7) tart cherries 8) virgin olive oil 9) chilli peppers 10) mint 11) krill oil  Golfer's Elbow Rehab It is normal to feel mild stretching, pulling, tightness, or discomfort as you do these exercises, but you should stop right away if you feel sudden pain or your pain gets worse.   Stretching and range of motion exercises These exercises warm up your muscles and joints and improve the movement and flexibility of your elbow. Exercise A: Wrist flexors  Straighten your left / right elbow in front of you with your palm up. If told by your health care provider, do this stretch with your elbow bent rather than straight. With your other hand, gently pull your left / right hand and fingers toward you until you feel a gentle stretch on the top of your forearm. Hold this position for 30 seconds. Repeat 2 times. Complete this exercise 3 times per week. Strengthening exercises These exercises build strength and endurance in your elbow. Endurance is the ability to use your muscles for a long time, even after several repetitions.  Exercise B: Wrist flexion  Sit with your left / right forearm palm-up and supported on a table or other surface. Let your left / right wrist extend over the edge of the surface. Hold a 3-5 lb weight or a piece of rubber exercise band or tubing. Slowly curl your hand up toward your forearm. Try to only move your hand and keep the rest of your arm still. Hold this position for 3-5 seconds. Slowly return to the starting position. Repeat 2 times. Complete this exercise 3 times per week. Exercise C:  Eccentric wrist flexion Sit with your left / right forearm palm-up and supported on a table or other surface. Let your left / right wrist extend over the edge of the surface. Hold a 3-5 lb weight or a piece of rubber exercise band or tubing. Use your other hand to move your left / right hand up toward your forearm. Slowly return to the starting position using only your left / right hand. Repeat 2 times. Complete this exercise 3 times per week. Exercise D: Hand turns, pronation i Sit with your left / right forearm supported on a table or other surface. Move your wrist so your pinkie travels toward your forearm and your thumb moves away from your forearm. Hold this position for 3 seconds. Slowly return to the starting position. Exercise E: Hand turns, pronation ii  Sit with your left / right forearm supported on a table or other surface. Hold a hammer in your left / right hand. The exercise will be easier if you hold on near the head of the hammer. If you hold on toward the end of the handle, the exercise will be harder. Rest your left / right hand over the edge of the table with your palm facing up. Without moving your elbow, slowly turn your palm and your hand down toward the table. Hold this position for 3 seconds. Slowly return to the starting position. Repeat 2 times. Complete this exercise 3 times per  week. Exercise F: Shoulder blade squeeze Sit in a stable chair with good posture. Do not let your back touch the back of the chair. Your arms should be at your sides with your elbows bent. You can rest your forearms on a pillow. Squeeze your shoulder blades together. Keep your shoulders level. Do not lift your shoulders up toward your ears. Hold this position for 3 seconds. Slowly release. Repeat 2 times. Complete this exercise 3 times per week. Make sure you discuss any questions you have with your health care provider. Document Released: 11/12/2005 Document Revised: 07/19/2016  Document Reviewed: 07/25/2015 Elsevier Interactive Patient Education  2018 [employer]  Wrist and Forearm Exercises Do exercises exactly as told by your health care provider and adjust them as directed. It is normal to feel mild stretching, pulling, tightness, or discomfort as you do these exercises, but you should stop right away if you feel sudden pain or your pain gets worse.   RANGE OF MOTION EXERCISES These exercises warm up your muscles and joints and improve the movement and flexibility of your injured wrist and forearm. These exercises also help to relieve pain, numbness, and tingling. These exercises are done using the muscles in your injured wrist and forearm. Exercise A: Wrist Flexion, Active With your fingers relaxed, bend your wrist forward as far as you can. Hold this position for 30 seconds. Repeat 2 times. Complete this exercise 3 times per week. Exercise B: Wrist Extension, Active With your fingers relaxed, bend your wrist backward as far as you can. Hold this position for 30 seconds. Repeat 2 times. Complete this exercise 3 times per week. Exercise C: Supination, Active  Stand or sit with your arms at your sides. Bend your left / right elbow to an "L" shape (90 degrees). Turn your palm upward until you feel a gentle stretch on the inside of your forearm. Hold this position for 30 seconds. Slowly return your palm to the starting position. Repeat 2 times. Complete this exercise 3 times per week. Exercise D: Pronation, Active  Stand or sit with your arms at your sides. Bend your left / right elbow to an "L" shape (90 degrees). Turn your palm downward until you feel a gentle stretch on the top of your forearm. Hold this position for 30 seconds. Slowly return your palm to the starting position. Repeat 2 times. Complete this exercise once a day.  STRETCHING EXERCISES These exercises warm up your muscles and joints and improve the movement and flexibility of your  injured wrist and forearm. These exercises also help to relieve pain, numbness, and tingling. These exercises are done using your healthy wrist and forearm to help stretch the muscles in your injured wrist and forearm. Exercise E: Wrist Flexion, Passive  Extend your left / right arm in front of you, relax your wrist, and point your fingers downward. Gently push on the back of your hand. Stop when you feel a gentle stretch on the top of your forearm. Hold this position for 30 seconds. Repeat 2 times. Complete this exercise 3 times per week. Exercise F: Wrist Extension, Passive  Extend your left / right arm in front of you and turn your palm upward. Gently pull your palm and fingertips back so your fingers point downward. You should feel a gentle stretch on the palm-side of your forearm. Hold this position for 30 seconds. Repeat 2 times. Complete this exercise 3 times per week. Exercise G: Forearm Rotation, Supination, Passive Sit with your left / right  elbow bent to an "L" shape (90 degrees) with your forearm resting on a table. Keeping your upper body and shoulder still, use your other hand to rotate your forearm palm-up until you feel a gentle to moderate stretch. Hold this position for 30 seconds. Slowly release the stretch and return to the starting position. Repeat 2 times. Complete this exercise 3 times per week. Exercise H: Forearm Rotation, Pronation, Passive Sit with your left / right elbow bent to an "L" shape (90 degrees) with your forearm resting on a table. Keeping your upper body and shoulder still, use your other hand to rotate your forearm palm-down until you feel a gentle to moderate stretch. Hold this position for 30 seconds. Slowly release the stretch and return to the starting position. Repeat 2 times. Complete this exercise 3 times per week.  STRENGTHENING EXERCISES These exercises build strength and endurance in your wrist and forearm. Endurance is the ability to use  your muscles for a long time, even after they get tired. Exercise I: Wrist Flexors  Sit with your left / right forearm supported on a table and your hand resting palm-up over the edge of the table. Your elbow should be bent to an "L" shape (about 90 degrees) and be below the level of your shoulder. Hold a 3-5 lb weight in your left / right hand. Or, hold a rubber exercise band or tube in both hands, keeping your hands at the same level and hip distance apart. There should be a slight tension in the exercise band or tube. Slowly curl your hand up toward your forearm. Hold this position for 3 seconds. Slowly lower your hand back to the starting position. Repeat 2 times. Complete this exercise 3 times per week. Exercise J: Wrist Extensors  Sit with your left / right forearm supported on a table and your hand resting palm-down over the edge of the table. Your elbow should be bent to an "L" shape (about 90 degrees) and be below the level of your shoulder. Hold a 3-5 lb weight in your left / right hand. Or, hold a rubber exercise band or tube in both hands, keeping your hands at the same level and hip distance apart. There should be a slight tension in the exercise band or tube. Slowly curl your hand up toward your forearm. Hold this position for 3 seconds. Slowly lower your hand back to the starting position. Repeat 2 times. Complete this exercise 3 times per week. Exercise K: Forearm Rotation, Supination  Sit with your left / right forearm supported on a table and your hand resting palm-down. Your elbow should be at your side, bent to an "L" shape (about 90 degrees), and below the level of your shoulder. Keep your wrist stable and in a neutral position throughout the exercise. Gently hold a lightweight hammer with your left / right hand. Without moving your elbow or wrist, slowly rotate your palm upward to a thumbs-up position. Hold this position for 3 seconds. Slowly return your forearm to the  starting position. Repeat 2 times. Complete this exercise 3 times per week. Exercise L: Forearm Rotation, Pronation  Sit with your left / right forearm supported on a table and your hand resting palm-up. Your elbow should be at your side, bent to an "L" shape (about 90 degrees), and below the level of your shoulder. Keep your wrist stable. Do not allow it to move backward or forward during the exercise. Gently hold a lightweight hammer with your left /  right hand. Without moving your elbow or wrist, slowly rotate your palm and hand upward to a thumbs-up position. Hold this position for 3 seconds. Slowly return your forearm to the starting position. Repeat 2 times. Complete this exercise 3 times per week. Exercise M: Grip Strengthening  Hold one of these items in your left / right hand: play dough, therapy putty, a dense sponge, a stress ball, or a large, rolled sock. Squeeze as hard as you can without increasing pain. Hold this position for 5 seconds. Slowly release your grip. Repeat 2 times. Complete this exercise 3 times per week.  This information is not intended to replace advice given to you by your health care provider. Make sure you discuss any questions you have with your health care provider. Document Released: 09/26/2005 Document Revised: 08/06/2016 Document Reviewed: 08/07/2015 Elsevier Interactive Patient Education  [employer].

## 2023-05-09 ENCOUNTER — Other Ambulatory Visit: Payer: Self-pay | Admitting: Family Medicine

## 2023-05-09 MED ORDER — PREDNISONE 20 MG PO TABS: 40.0000 mg | ORAL_TABLET | Freq: Every day | ORAL | 0 refills | Status: AC

## 2023-05-14 LAB — ALDOLASE

## 2023-05-14 LAB — SJOGRENS SYNDROME-A EXTRACTABLE NUCLEAR ANTIBODY: SSA (Ro) (ENA) Antibody, IgG: <1 AI

## 2023-05-14 LAB — SJOGRENS SYNDROME-B EXTRACTABLE NUCLEAR ANTIBODY: SSB (La) (ENA) Antibody, IgG: <1 AI

## 2023-05-14 LAB — CYCLIC CITRUL PEPTIDE ANTIBODY, IGG: Cyclic Citrullin Peptide Ab: <16 UNITS

## 2023-05-14 LAB — ANTI-SMITH ANTIBODY: ENA SM Ab Ser-aCnc: <1 AI

## 2023-05-14 LAB — RHEUMATOID FACTOR: Rheumatoid fact SerPl-aCnc: <10 IU/mL (ref <14)

## 2023-05-14 LAB — ANTI-DNA ANTIBODY, DOUBLE-STRANDED: ds DNA Ab: <1 IU/mL

## 2023-05-22 ENCOUNTER — Encounter: Payer: Self-pay | Admitting: Family Medicine

## 2023-05-23 ENCOUNTER — Other Ambulatory Visit: Payer: Self-pay | Admitting: Family Medicine

## 2023-05-23 MED ORDER — ALLOPURINOL 100 MG PO TABS: 100.0000 mg | ORAL_TABLET | Freq: Every day | ORAL | 6 refills | Status: DC

## 2023-05-23 MED ORDER — COLCHICINE 0.6 MG PO TABS: ORAL_TABLET | ORAL | 1 refills | Status: DC

## 2023-05-29 ENCOUNTER — Ambulatory Visit: Payer: 59 | Admitting: Family Medicine

## 2023-05-31 ENCOUNTER — Other Ambulatory Visit: Payer: Self-pay | Admitting: Family Medicine

## 2023-06-10 ENCOUNTER — Encounter: Payer: Self-pay | Admitting: Family Medicine

## 2023-06-10 ENCOUNTER — Ambulatory Visit: Payer: 59 | Admitting: Family Medicine

## 2023-06-10 VITALS — BP 110/70 | HR 78 | Temp 98.5°F | Ht 72.0 in | Wt 226.5 lb

## 2023-06-10 DIAGNOSIS — E790 Hyperuricemia without signs of inflammatory arthritis and tophaceous disease: Secondary | ICD-10-CM | POA: Diagnosis not present

## 2023-06-10 DIAGNOSIS — M79671 Pain in right foot: Secondary | ICD-10-CM | POA: Diagnosis not present

## 2023-06-10 DIAGNOSIS — M79672 Pain in left foot: Secondary | ICD-10-CM | POA: Diagnosis not present

## 2023-06-10 LAB — URIC ACID: Uric Acid, Serum: 5.7 mg/dL (ref 4.0–7.8)

## 2023-06-10 MED ORDER — METHYLPREDNISOLONE ACETATE 80 MG/ML IJ SUSP
80.0000 mg | Freq: Once | INTRAMUSCULAR | Status: AC
  Administered 2023-06-10: 80 mg via INTRAMUSCULAR

## 2023-06-10 NOTE — Progress Notes (Signed)
Chief Complaint  Patient presents with   uric acid    Subjective: Patient is a 42 y.o. male here for f/u.  Urate was 8.4. started on allopurinol 100 mg/d. Currently on 100 mg bid. Has been taking colchicine for acute therapy for over a month. Seems to be getting worse. He has hand/elbow pain b/l. Has been eating a lot of cherries, Vit C and avoiding high purine containing foods. B/l foot pain within the past mo.   Past Medical History:  Diagnosis Date   Recurrent depression (HCC)     Objective: BP 110/70 (BP Location: Left Arm, Patient Position: Sitting, Cuff Size: Large)   Pulse 78   Temp 98.5 F (36.9 C) (Oral)   Ht 6' (1.829 m)   Wt 226 lb 8 oz (102.7 kg)   SpO2 96%   BMI 30.72 kg/m  General: Awake, appears stated age MSK: Feet-high arches bilaterally, appears to put more of his weight on the lateral portion of his feet; there is no TTP over the plantar surface of his feet Hands- No obvious deformity or edema.  There is no TTP. Elbows-no bony tenderness.  There is some mild TTP over the proximal forearm flexors bilaterally without ecchymosis, edema, or erythema. Lungs: No accessory muscle use Psych: Age appropriate judgment and insight, normal affect and mood  Assessment and Plan: Hyperuricemia - Plan: Uric acid, methylPREDNISolone acetate (DEPO-MEDROL) injection 80 mg  Bilateral foot pain - Plan: methylPREDNISolone acetate (DEPO-MEDROL) injection 80 mg  IM Depo today.  Arch supports recommended.  Check uric acid.  If still elevated, we will need to increase his uric acid lowering medication, allopurinol 300 mg daily.  For now he will continue 100 mg twice daily.  Continue with his low purine diet.  I would like him to return to the stretches and exercises for his elbows.  Would consider sports medicine referral if no obvious hyperuricemia.  He does already see Dr. Denyse Amass. The patient voiced understanding and agreement to the plan.  Jilda Roche [place], DO 06/10/23   9:29 AM

## 2023-06-10 NOTE — Patient Instructions (Addendum)
Ice/cold pack over area for 10-15 min twice daily.  OK to take Tylenol 1000 mg (2 extra strength tabs) or 975 mg (3 regular strength tabs) every 6 hours as needed.  Give [place] 2-3 business days to get the results of your labs back.   Consider Powerstep insoles. There are very quality over the counter inserts. Shop around online and in stores. Dr. Margart Sickles is a cheaper alternative, though is not as high of quality.   Try doing the stretches for the elbow region.   Let us know if you need anything.

## 2023-06-11 NOTE — Progress Notes (Unsigned)
[name] [address] [place] [address] Phone: [phone] Subjective:   [name], am serving as a scribe for Dr. Antoine Primas.  I'm seeing this patient by the request  of:  [name], DO  CC: hand and foot pain   [id number]  Devin Hoffman is a 42 y.o. male coming in with complaint of B hand and B foot pain. Seen for B knee pain in June and given steroid injections. Patient states high uric acid was having pain in hands and elbow. Pain in palmer side and elbow is on the medial side. Pain is constant and dull and random sharp. Feet pain on the lateral side towards heel, ball of foot, and heel. Random sharp pains.      Past Medical History:  Diagnosis Date   Recurrent depression (HCC)    Past Surgical History:  Procedure Laterality Date   HAND SURGERY Left    KNEE SURGERY     Social History   Socioeconomic History   Marital status: Married    Spouse name: Not on file   Number of children: Not on file   Years of education: Not on file   Highest education level: Associate degree: occupational, Scientist, product/process development, or vocational program  Occupational History   Not on file  Tobacco Use   Smoking status: Former    Current packs/day: 0.00    Types: Cigarettes    Quit date: 04/05/2013    Years since quitting: 10.1   Smokeless tobacco: Never  Substance and Sexual Activity   Alcohol use: No   Drug use: No   Sexual activity: Yes    Partners: Female    Birth control/protection: None  Other Topics Concern   Not on file  Social History Narrative   Not on file   Social Determinants of Health   Financial Resource Strain: Low Risk  (03/07/2023)   Overall Financial Resource Strain (CARDIA)    Difficulty of Paying Living Expenses: Not hard at all  Food Insecurity: No Food Insecurity (03/07/2023)   Hunger Vital Sign    Worried About Running Out of Food in the Last Year: Never true    Ran Out of Food in the Last Year:  Never true  Transportation Needs: No Transportation Needs (03/07/2023)   PRAPARE - Administrator, Civil Service (Medical): No    Lack of Transportation (Non-Medical): No  Physical Activity: Insufficiently Active (03/07/2023)   Exercise Vital Sign    Days of Exercise per Week: 3 days    Minutes of Exercise per Session: 30 min  Stress: Stress Concern Present (03/07/2023)   [employer] of Occupational Health - Occupational Stress Questionnaire    Feeling of Stress : To some extent  Social Connections: Moderately Isolated (03/07/2023)   Social Connection and Isolation Panel [NHANES]    Frequency of Communication with Friends and Family: More than three times a week    Frequency of Social Gatherings with Friends and Family: Once a week    Attends Religious Services: Never    Database administrator or Organizations: No    Attends Engineer, structural: Not on file    Marital Status: Married   No Known Allergies Family History  Problem Relation Age of Onset   Alcohol abuse Father    Gout Father    Autism spectrum disorder Brother    Colon cancer Maternal Grandmother    Pancreatic cancer Paternal Grandfather  Current Outpatient Medications (Cardiovascular):    EPINEPHrine (EPIPEN 2-PAK) 0.3 mg/0.3 mL IJ SOAJ injection, Inject 0.3 mg into the muscle as needed for anaphylaxis.  Current Outpatient Medications (Respiratory):    albuterol (VENTOLIN HFA) 108 (90 Base) MCG/ACT inhaler, Inhale 2 puffs into the lungs every 6 (six) hours as needed for wheezing or shortness of breath.  Current Outpatient Medications (Analgesics):    allopurinol (ZYLOPRIM) 100 MG tablet, Take 1 tablet (100 mg total) by mouth daily.   colchicine 0.6 MG tablet, TAKE 2 TABS AT FIRST SIGN OF A FLARE AND REPEAT 1 TAB 1 HR LATER. TAKE 1 TAB TWICE DAILY UNTIL FLARE RESOLVES.   Current Outpatient Medications (Other):    amphetamine-dextroamphetamine (ADDERALL XR) 20 MG 24 hr capsule, Take 1  capsule (20 mg total) by mouth every morning.   amphetamine-dextroamphetamine (ADDERALL XR) 20 MG 24 hr capsule, Take 1 capsule (20 mg total) by mouth every morning.   amphetamine-dextroamphetamine (ADDERALL XR) 20 MG 24 hr capsule, Take 1 capsule (20 mg total) by mouth every morning.   Reviewed prior external information including notes and imaging from  primary care provider As well as notes that were available from care everywhere and other healthcare systems.  Past medical history, social, surgical and family history all reviewed in electronic medical record.  No pertanent information unless stated regarding to the chief complaint.   Review of Systems:  No headache, visual changes, nausea, vomiting, diarrhea, constipation, dizziness, abdominal pain, skin rash, fevers, chills, night sweats, weight loss, swollen lymph nodes, body aches, joint swelling, chest pain, shortness of breath, mood changes. POSITIVE muscle aches, body aches  Objective  Blood pressure (!) 118/90, pulse 85, height 6' (1.829 m), weight 227 lb (103 kg), SpO2 96%.   General: No apparent distress alert and oriented x3 mood and affect normal, dressed appropriately.  HEENT: Pupils equal, extraocular movements intact  Respiratory: Patient's speak in full sentences and does not appear short of breath  Cardiovascular: No lower extremity edema, non tender, no erythema  Hand exam bilaterally does show good grip strength noted.  Negative Tinel's sign noted in the ulnar aspect or at the carpal tunnel area.  No significant findings of poor capillary refill noted today either.  Limited muscular skeletal ultrasound was performed and interpreted by Antoine Primas, M  Limited ultrasound shows that there is no significant hypoechoic changes or dilatation noted of the median nerve.  Wrist otherwise appears to be unremarkable.  Elbow exam also shows no significant subluxation or dilatation noted of the ulnar nerve at the  elbow. 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    Impression and Recommendations:     The above documentation has been reviewed and is accurate and complete Judi Saa, DO

## 2023-06-12 ENCOUNTER — Other Ambulatory Visit: Payer: Self-pay | Admitting: Family Medicine

## 2023-06-12 ENCOUNTER — Encounter: Payer: Self-pay | Admitting: Family Medicine

## 2023-06-12 ENCOUNTER — Ambulatory Visit: Payer: 59 | Admitting: Family Medicine

## 2023-06-12 ENCOUNTER — Other Ambulatory Visit: Payer: Self-pay

## 2023-06-12 VITALS — BP 118/90 | HR 85 | Ht 72.0 in | Wt 227.0 lb

## 2023-06-12 DIAGNOSIS — M25531 Pain in right wrist: Secondary | ICD-10-CM

## 2023-06-12 DIAGNOSIS — M79641 Pain in right hand: Secondary | ICD-10-CM | POA: Diagnosis not present

## 2023-06-12 DIAGNOSIS — M25532 Pain in left wrist: Secondary | ICD-10-CM

## 2023-06-12 DIAGNOSIS — M2550 Pain in unspecified joint: Secondary | ICD-10-CM

## 2023-06-12 DIAGNOSIS — M79642 Pain in left hand: Secondary | ICD-10-CM

## 2023-06-12 LAB — B12 AND FOLATE PANEL
Folate: 10 ng/mL (ref >5.9)
Vitamin B-12: 438 pg/mL (ref 211–911)

## 2023-06-12 LAB — C-REACTIVE PROTEIN: CRP: <1 mg/dL (ref 0.5–20.0)

## 2023-06-12 LAB — SEDIMENTATION RATE: Sed Rate: 13 mm/hr (ref 0–15)

## 2023-06-12 LAB — TSH: TSH: 3.51 u[IU]/mL (ref 0.35–5.50)

## 2023-06-12 LAB — T4, FREE: Free T4: 0.81 ng/dL (ref 0.60–1.60)

## 2023-06-12 LAB — T3, FREE: T3, Free: 3.8 pg/mL (ref 2.3–4.2)

## 2023-06-12 LAB — VITAMIN D 25 HYDROXY (VIT D DEFICIENCY, FRACTURES): VITD: 15.93 ng/mL — ABNORMAL LOW (ref 30.00–100.00)

## 2023-06-12 MED ORDER — VITAMIN D (ERGOCALCIFEROL) 1.25 MG (50000 UNIT) PO CAPS: 50000.0000 [IU] | ORAL_CAPSULE | ORAL | 0 refills | Status: DC

## 2023-06-12 NOTE — Assessment & Plan Note (Signed)
Patient is having bilateral hand pain with the distribution and seems to be in the C6 and C8 distribution.  Very difficult to tell.  Patient does not have any true weakness noted.  Having the lower extremity.  Likely no weakness is noted and deep tendon reflexes are intact.  Has been going on though for several months with no significant improvement in patient does think it is seeming to worsen.  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  I think at this point I would like to get laboratory workup including B12, thyroid panel, iron and calcium levels to see if anything could be contributing to this.  Will get sedimentation rate to rule out any inflammatory aspects that could be happening as well.  Depending on findings then we will consider nerve conduction test or potentially MRI of the brain and cervical to rule out such things as multiple sclerosis.  Follow-up again in 2 to 3 months

## 2023-06-12 NOTE — Patient Instructions (Addendum)
Labs today If normal then we'll discuss [place] vs nerve conduction See you again in 2-3 months

## 2023-06-13 LAB — PTH, INTACT AND CALCIUM
Calcium: 9.6 mg/dL (ref 8.6–10.3)
PTH: 59 pg/mL (ref 16–77)

## 2023-06-13 LAB — THYROID PEROXIDASE ANTIBODIES (TPO) (REFL): Thyroperoxidase Ab SerPl-aCnc: 8 IU/mL (ref <9)

## 2023-07-03 ENCOUNTER — Ambulatory Visit: Payer: 59 | Admitting: Internal Medicine

## 2023-07-03 ENCOUNTER — Ambulatory Visit (HOSPITAL_BASED_OUTPATIENT_CLINIC_OR_DEPARTMENT_OTHER)
Admission: RE | Admit: 2023-07-03 | Discharge: 2023-07-03 | Disposition: A | Discharge status: Home / Self Care | Payer: 59 | Source: Ambulatory Visit | Attending: Internal Medicine | Admitting: Internal Medicine

## 2023-07-03 ENCOUNTER — Encounter: Payer: Self-pay | Admitting: Internal Medicine

## 2023-07-03 VITALS — BP 128/82 | HR 79 | Temp 98.2°F | Ht 72.0 in | Wt 227.0 lb

## 2023-07-03 DIAGNOSIS — E790 Hyperuricemia without signs of inflammatory arthritis and tophaceous disease: Secondary | ICD-10-CM

## 2023-07-03 DIAGNOSIS — M79641 Pain in right hand: Secondary | ICD-10-CM

## 2023-07-03 DIAGNOSIS — M79642 Pain in left hand: Secondary | ICD-10-CM | POA: Insufficient documentation

## 2023-07-03 LAB — URIC ACID: Uric Acid, Serum: 5.4 mg/dL (ref 4.0–7.8)

## 2023-07-03 MED ORDER — MELOXICAM 7.5 MG PO TABS: 7.5000 mg | ORAL_TABLET | Freq: Every day | ORAL | 2 refills | Status: DC

## 2023-07-03 NOTE — Patient Instructions (Addendum)
Proceed with blood work  Get x-rays of your hands downstairs.  Will take several days to get the results  Decrease allopurinol 100 mg to 1 tablet daily, then after a week stop it if you like to.  Will refer you to rheumatology   Okay to take meloxicam once daily as needed. Always take it with food because may cause gastritis and ulcers.  If you notice nausea, stomach pain, change in the color of stools --->  Stop the medicine and let us know  You can also take Tylenol from time to time   Come back and see your primary doctor in 3 months

## 2023-07-03 NOTE — Progress Notes (Signed)
Subjective:    Patient ID: Devin Hoffman, male    DOB: Apr 02, 1981, 42 y.o.   MRN: [id number]  DOS:  07/03/2023 Type of visit - description: Acute.  Chart reviewed 05/08/2023: Uric acid 8.4. Rx allopurinol 100 mg daily.  06/10/2023: Had a follow up with PCP, uric acid decreased to 5.7. Also had feet pain,  got a IM methylprednisolone injection.  06/12/2023: Saw sports medicine, multiple labs done, they were essentially okay. Vitamin D was low.   The patient is here because of ongoing hand, elbow and sometimes feet pain. Pain is at different places in the hand, depending on the day. Allopurinol not helping even when he increased to 200 mg daily. Colchicine: Made pain  worse?    Denies at any time any joint being red, hot or swollen. No fever or chills No headache No myalgias.  Review of Systems See above   Past Medical History:  Diagnosis Date   Recurrent depression (HCC)     Past Surgical History:  Procedure Laterality Date   HAND SURGERY Left    KNEE SURGERY      Current Outpatient Medications  Medication Instructions   albuterol (VENTOLIN HFA) 108 (90 Base) MCG/ACT inhaler 2 puffs, Inhalation, Every 6 hours PRN   allopurinol (ZYLOPRIM) 100 mg, Oral, Daily   amphetamine-dextroamphetamine (ADDERALL XR) 20 MG 24 hr capsule 20 mg, Oral, BH-each morning   amphetamine-dextroamphetamine (ADDERALL XR) 20 MG 24 hr capsule 20 mg, Oral, BH-each morning   amphetamine-dextroamphetamine (ADDERALL XR) 20 MG 24 hr capsule 20 mg, Oral, BH-each morning   colchicine 0.6 MG tablet TAKE 2 TABS AT FIRST SIGN OF A FLARE AND REPEAT 1 TAB 1 HR LATER. TAKE 1 TAB TWICE DAILY UNTIL FLARE RESOLVES.   EPINEPHrine (EPIPEN 2-PAK) 0.3 mg, Intramuscular, As needed   Vitamin D (Ergocalciferol) (DRISDOL) 50,000 Units, Oral, Every 7 days       Objective:   Physical Exam BP 128/82   Pulse 79   Temp 98.2 F (36.8 C)   Ht 6' (1.829 m)   Wt 227 lb (103 kg)   SpO2 99%   BMI 30.79 kg/m   General:   Well developed, NAD, BMI noted. HEENT:  Normocephalic . Face symmetric, atraumatic MSK: Hands, wrists, fingers, feet: No synovitis. Skin: Not pale. Not jaundice Neurologic:  alert & oriented X3.  Speech normal, gait appropriate for age and unassisted Psych--  Cognition and judgment appear intact.  Cooperative with normal attention span and concentration.  Behavior appropriate. No anxious or depressed appearing.      Assessment     42 year old gentleman, PMH includes hyperlipidemia, depression, ADHD, statin intolerance, hyperuricemia.  Presents with  Hand, elbow, feet pain ongoing Hyperuricemia.  Symptoms started few months ago, uric acid was slightly elevated, level  improved after initiation of allopurinol. Saw sports medicine for above symptoms, extensive workup negative. He is here because pain continue and is extremely bothersome to the patient. No synovitis on exam Suspect this could be early DJD versus some unusual arthritis. Recommend to get hand x-rays and I am referring her to rheumatology. He requested strongly to get uric acid levels which I will do.   Also wonders if he could stop allopurinol, currently self increased to 200 mg daily. My advice was to stay on 100 mg daily b/c  although he -so far-  has not have any gout episodes he could trigger one by changing allopurinol doses. He really like to try without it thus recommend to wean off and  see how he does without allopurinol. For pain control recommend meloxicam with GI precaution, see AVS RTC 3 months to see PCP

## 2023-07-08 ENCOUNTER — Encounter: Payer: Self-pay | Admitting: Internal Medicine

## 2023-07-08 NOTE — Telephone Encounter (Signed)
Gwen- can you try sending referral information over to Dr. Herma Carson at [facility] please?

## 2023-07-08 NOTE — Telephone Encounter (Signed)
Please try a different rheumatology group perhaps in [facility]

## 2023-07-09 ENCOUNTER — Ambulatory Visit: Payer: 59 | Admitting: Family Medicine

## 2023-09-05 NOTE — Progress Notes (Signed)
[name] [address] [place] [address] Phone: [phone] Subjective:   Devin Hoffman, am serving as a scribe for Dr. Antoine Primas.  I'm seeing this patient by the request  of:  [name], DO  CC: Bilateral hand pain  [id number]  06/12/2023 Patient is having bilateral hand pain with the distribution and seems to be in the C6 and C8 distribution.  Very difficult to tell.  Patient does not have any true weakness noted.  Having the lower extremity.  Likely no weakness is noted and deep tendon reflexes are intact.  Has been going on though for several months with no significant improvement in patient does think it is seeming to worsen.  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  I think at this point I would like to get laboratory workup including B12, thyroid panel, iron and calcium levels to see if anything could be contributing to this.  Will get sedimentation rate to rule out any inflammatory aspects that could be happening as well.  Depending on findings then we will consider nerve conduction test or potentially MRI of the brain and cervical to rule out such things as multiple sclerosis.  Follow-up again in 2 to 3 months     Update 09/11/2023 Devin Hoffman is a 42 y.o. male coming in with complaint of B hand pain. Patient states that his pain is the same as last visit. Also c/o numbness in both feet as well. Saw rheumatology.        Past Medical History:  Diagnosis Date   Recurrent depression (HCC)    Past Surgical History:  Procedure Laterality Date   HAND SURGERY Left    KNEE SURGERY     Social History   Socioeconomic History   Marital status: Married    Spouse name: Not on file   Number of children: Not on file   Years of education: Not on file   Highest education level: Associate degree: occupational,  Scientist, product/process development, or vocational program  Occupational History   Not on file  Tobacco Use   Smoking status: Former    Current packs/day: 0.00    Types: Cigarettes    Quit date: 04/05/2013    Years since quitting: 10.4   Smokeless tobacco: Never  Substance and Sexual Activity   Alcohol use: No   Drug use: No   Sexual activity: Yes    Partners: Female    Birth control/protection: None  Other Topics Concern   Not on file  Social History Narrative   Not on file   Social Determinants of Health   Financial Resource Strain: Low Risk  (03/07/2023)   Overall Financial Resource Strain (CARDIA)    Difficulty of Paying Living Expenses: Not hard at all  Food Insecurity: No Food Insecurity (03/07/2023)   Hunger Vital Sign    Worried About Running Out of Food in the Last Year: Never true    Ran Out of Food in the Last Year: Never true  Transportation Needs: No Transportation Needs (03/07/2023)   PRAPARE - Administrator, Civil Service (Medical): No    Lack of Transportation (Non-Medical): No  Physical Activity: Insufficiently Active (03/07/2023)   Exercise Vital Sign    Days of Exercise per Week: 3 days    Minutes of Exercise per Session: 30 min  Stress: Stress Concern Present (  03/07/2023)   [employer] [employer] - Occupational Stress Questionnaire    Feeling of Stress : To some extent  Social Connections: Moderately Isolated (03/07/2023)   Social Connection and Isolation Panel [NHANES]    Frequency of Communication with Friends and Family: More than three times a week    Frequency of Social Gatherings with Friends and Family: Once a week    Attends Religious Services: Never    Database administrator or Organizations: No    Attends Engineer, structural: Not on file    Marital Status: Married   No Known Allergies Family History  Problem Relation Age of Onset   Alcohol abuse Father    Gout Father    Autism spectrum disorder Brother    Colon cancer  Maternal Grandmother    Pancreatic cancer Paternal Grandfather      Current Outpatient Medications (Cardiovascular):    EPINEPHrine (EPIPEN 2-PAK) 0.3 mg/0.3 mL IJ SOAJ injection, Inject 0.3 mg into the muscle as needed for anaphylaxis.  Current Outpatient Medications (Respiratory):    albuterol (VENTOLIN HFA) 108 (90 Base) MCG/ACT inhaler, Inhale 2 puffs into the lungs every 6 (six) hours as needed for wheezing or shortness of breath.  Current Outpatient Medications (Analgesics):    allopurinol (ZYLOPRIM) 100 MG tablet, Take 1 tablet (100 mg total) by mouth daily.   colchicine 0.6 MG tablet, TAKE 2 TABS AT FIRST SIGN OF A FLARE AND REPEAT 1 TAB 1 HR LATER. TAKE 1 TAB TWICE DAILY UNTIL FLARE RESOLVES.   meloxicam (MOBIC) 7.5 MG tablet, Take 1 tablet (7.5 mg total) by mouth daily.   Current Outpatient Medications (Other):    amphetamine-dextroamphetamine (ADDERALL XR) 20 MG 24 hr capsule, Take 1 capsule (20 mg total) by mouth every morning.   amphetamine-dextroamphetamine (ADDERALL XR) 20 MG 24 hr capsule, Take 1 capsule (20 mg total) by mouth every morning.   amphetamine-dextroamphetamine (ADDERALL XR) 20 MG 24 hr capsule, Take 1 capsule (20 mg total) by mouth every morning.   Vitamin D, Ergocalciferol, (DRISDOL) 1.25 MG (50000 UNIT) CAPS capsule, Take 1 capsule (50,000 Units total) by mouth every 7 (seven) days.   Reviewed prior external information including notes and imaging from  primary care provider As well as notes that were available from care everywhere and other healthcare systems.  Past medical history, social, surgical and family history all reviewed in electronic medical record.  No pertanent information unless stated regarding to the chief complaint.   Review of Systems:  No headache, visual changes, nausea, vomiting, diarrhea, constipation, dizziness, abdominal pain, skin rash, fevers, chills, night sweats, weight loss, swollen lymph nodes, body aches, joint swelling,  chest pain, shortness of breath, mood changes. POSITIVE muscle aches, fatigue  Objective  Blood pressure 126/86, pulse 74, height 6' (1.829 m), weight 223 lb (101.2 kg), SpO2 98%.   General: No apparent distress alert and oriented x3 mood and affect normal, dressed appropriately.  HEENT: Pupils equal, extraocular movements intact  Respiratory: Patient's speak in full sentences and does not appear short of breath  Cardiovascular: No lower extremity edema, non tender, no erythema  Bilateral hand exam shows patient does have relatively good strength at the moment.  Tightness noted though with full flexion and extension.  Mild discomfort with in the feet as well but good strength of the lower extremities.    Impression and Recommendations:     The above documentation has been reviewed and is accurate and complete Judi Saa, DO

## 2023-09-11 ENCOUNTER — Ambulatory Visit: Payer: 59 | Admitting: Family Medicine

## 2023-09-11 ENCOUNTER — Encounter: Payer: Self-pay | Admitting: Family Medicine

## 2023-09-11 ENCOUNTER — Other Ambulatory Visit: Payer: Self-pay | Admitting: Family Medicine

## 2023-09-11 VITALS — BP 126/86 | HR 74 | Ht 72.0 in | Wt 223.0 lb

## 2023-09-11 DIAGNOSIS — M25561 Pain in right knee: Secondary | ICD-10-CM

## 2023-09-11 DIAGNOSIS — E538 Deficiency of other specified B group vitamins: Secondary | ICD-10-CM

## 2023-09-11 DIAGNOSIS — M79641 Pain in right hand: Secondary | ICD-10-CM

## 2023-09-11 DIAGNOSIS — M2550 Pain in unspecified joint: Secondary | ICD-10-CM | POA: Diagnosis not present

## 2023-09-11 DIAGNOSIS — M5442 Lumbago with sciatica, left side: Secondary | ICD-10-CM | POA: Diagnosis not present

## 2023-09-11 DIAGNOSIS — M79642 Pain in left hand: Secondary | ICD-10-CM

## 2023-09-11 DIAGNOSIS — G629 Polyneuropathy, unspecified: Secondary | ICD-10-CM

## 2023-09-11 DIAGNOSIS — M25562 Pain in left knee: Secondary | ICD-10-CM

## 2023-09-11 DIAGNOSIS — G8929 Other chronic pain: Secondary | ICD-10-CM

## 2023-09-11 MED ORDER — CYANOCOBALAMIN 1000 MCG/ML IJ SOLN
1000.0000 ug | Freq: Once | INTRAMUSCULAR | Status: AC
Start: 2023-09-11 — End: 2023-09-11
  Administered 2023-09-11: 1000 ug via INTRAMUSCULAR

## 2023-09-11 NOTE — Assessment & Plan Note (Signed)
Unknown cause at this time, we have replaced all deficiencies at the moment and continues to have worsening of the hands as well as somewhat of the legs now.  With that being more systemic we did discuss different treatment options and given a short course of prednisone.  If continuing to have difficulty do think nerve conduction testing would be beneficial.  Discussed the potential for an MRI of the brain to rule out such things as a demyelinating disease that could be contributing.  Has not had significant other difficulties such as vision changes.  Follow-up again in 6 to 8 weeks otherwise.

## 2023-09-11 NOTE — Patient Instructions (Addendum)
B12 injection today Lyme disease panel  MRI brain  Nerve conduction bilat low and upp  6 week follow up to discuss results

## 2023-09-12 LAB — B. BURGDORFI ANTIBODIES: B burgdorferi Ab IgG+IgM: <0.9 {index}

## 2023-09-13 ENCOUNTER — Encounter: Payer: Self-pay | Admitting: Sports Medicine

## 2023-09-17 ENCOUNTER — Encounter: Payer: Self-pay | Admitting: Sports Medicine

## 2023-09-19 ENCOUNTER — Encounter: Payer: Self-pay | Admitting: Sports Medicine

## 2023-09-24 ENCOUNTER — Inpatient Hospital Stay
Admission: RE | Admit: 2023-09-24 | Discharge: 2023-09-24 | Disposition: A | Discharge status: Home / Self Care | Payer: 59 | Source: Ambulatory Visit | Attending: Sports Medicine

## 2023-09-24 ENCOUNTER — Other Ambulatory Visit: Payer: Self-pay | Admitting: Family Medicine

## 2023-09-24 DIAGNOSIS — M2550 Pain in unspecified joint: Secondary | ICD-10-CM

## 2023-09-24 DIAGNOSIS — G8929 Other chronic pain: Secondary | ICD-10-CM

## 2023-09-24 DIAGNOSIS — M79641 Pain in right hand: Secondary | ICD-10-CM

## 2023-09-24 MED ORDER — GADOPICLENOL 0.5 MMOL/ML IV SOLN
10.0000 mL | Freq: Once | INTRAVENOUS | Status: AC | PRN
  Administered 2023-09-24: 10 mL via INTRAVENOUS

## 2023-09-30 ENCOUNTER — Encounter: Payer: Self-pay | Admitting: Neurology

## 2023-09-30 ENCOUNTER — Ambulatory Visit (INDEPENDENT_AMBULATORY_CARE_PROVIDER_SITE_OTHER): Payer: 59 | Admitting: Neurology

## 2023-09-30 VITALS — BP 134/82 | HR 71 | Ht 72.0 in | Wt 229.5 lb

## 2023-09-30 DIAGNOSIS — R202 Paresthesia of skin: Secondary | ICD-10-CM | POA: Insufficient documentation

## 2023-09-30 MED ORDER — DULOXETINE HCL 30 MG PO CPEP: 30.0000 mg | ORAL_CAPSULE | Freq: Every day | ORAL | 5 refills | Status: DC

## 2023-09-30 NOTE — Progress Notes (Signed)
Chief Complaint  Patient presents with   New Patient (Initial Visit)    Rm14, ALONE, NP Internal referral for BUE and BLE"PAIN" NOT numbness (ACCORDING TO PT)  - [facility] EMG Consult: the pain has been going on for a year      ASSESSMENT AND PLAN  Devin Hoffman is a 42 y.o. male   Intermittent bilateral upper and lower extremity paresthesia,  Essentially normal neurological examinations  Extensive laboratory evaluation showed normal or negative TSH, ESR, C-reactive protein, Lyme titer, SSA antibody, DNA antibody, rheumatoid factor, uric acid, B12, folic acid,  EMG nerve conduction study to rule out peripheral neuropathy  Cymbalta 30 mg daily, if helps, makes titrating to 60 mg daily  DIAGNOSTIC DATA (LABS, IMAGING, TESTING) - I reviewed patient records, labs, notes, testing and imaging myself where available.   MEDICAL HISTORY:  Devin Hoffman is a 42 year old male, seen in request by Medicare Carmelia Roller, Jilda Roche for evaluation of intermittent upper and lower extremity paresthesia  History is obtained from the patient and review of electronic medical records. I personally reviewed pertinent available imaging films in PACS.   PMHx of  ADHD Depression, Left 3rd finger surgery,   Since 2024, he began to have intermittent limb paresthesia, initially thought due to statin use, but even after stopping statin for 1 year, he remains symptomatic, he described intermittent numbness tingling involving different part of his hands, and the foot, no persistent sensory loss, no gait abnormality  Personally reviewed MRI of the brain with and without contrast September 24, 2023 that was normal  He was given gabapentin 100 mg at bedtime, was not sure is helping  He took Cymbalta many years ago for depression,   it was helpful,  PHYSICAL EXAM:   Vitals:   09/30/23 1256  BP: 134/82  Pulse: 71  Weight: 229 lb 8 oz (104.1 kg)  Height: 6' (1.829 m)   Not recorded     Body mass  index is 31.13 kg/m.  PHYSICAL EXAMNIATION:  Gen: NAD, conversant, well nourised, well groomed                     Cardiovascular: Regular rate rhythm, no peripheral edema, warm, nontender. Eyes: Conjunctivae clear without exudates or hemorrhage Neck: Supple, no carotid bruits. Pulmonary: Clear to auscultation bilaterally   NEUROLOGICAL EXAM:  MENTAL STATUS: Speech/cognition: Awake, alert, oriented to history taking and casual conversation CRANIAL NERVES: CN II: Visual fields are full to confrontation. Pupils are round equal and briskly reactive to light. CN III, IV, VI: extraocular movement are normal. No ptosis. CN V: Facial sensation is intact to light touch CN VII: Face is symmetric with normal eye closure  CN VIII: Hearing is normal to causal conversation. CN IX, X: Phonation is normal. CN XI: Head turning and shoulder shrug are intact  MOTOR: There is no pronator drift of out-stretched arms. Muscle bulk and tone are normal. Muscle strength is normal.  REFLEXES: Reflexes are 2+ and symmetric at the biceps, triceps, knees, and ankles. Plantar responses are flexor.  SENSORY: Intact to light touch, pinprick and vibratory sensation are intact in fingers and toes.  COORDINATION: There is no trunk or limb dysmetria noted.  GAIT/STANCE: Posture is normal. Gait is steady with normal steps, base, arm swing, and turning. Heel and toe walking are normal. Tandem gait is normal.  Romberg is absent.  REVIEW OF SYSTEMS:  Full 14 system review of systems performed and notable only for as above All other review  of systems were negative.   ALLERGIES: No Known Allergies  HOME MEDICATIONS: Current Outpatient Medications  Medication Sig Dispense Refill   amphetamine-dextroamphetamine (ADDERALL XR) 20 MG 24 hr capsule Take 1 capsule (20 mg total) by mouth every morning. 30 capsule 0   EPINEPHrine (EPIPEN 2-PAK) 0.3 mg/0.3 mL IJ SOAJ injection Inject 0.3 mg into the muscle as  needed for anaphylaxis. 1 each 1   Vitamin D, Ergocalciferol, (DRISDOL) 1.25 MG (50000 UNIT) CAPS capsule TAKE 1 CAPSULE (50,000 UNITS TOTAL) BY MOUTH EVERY 7 (SEVEN) DAYS 12 capsule 0   No current facility-administered medications for this visit.    PAST MEDICAL HISTORY: Past Medical History:  Diagnosis Date   Recurrent depression (HCC)     PAST SURGICAL HISTORY: Past Surgical History:  Procedure Laterality Date   HAND SURGERY Left    KNEE SURGERY      FAMILY HISTORY: Family History  Problem Relation Age of Onset   Alcohol abuse Father    Gout Father    Autism spectrum disorder Brother    Colon cancer Maternal Grandmother    Pancreatic cancer Paternal Grandfather     SOCIAL HISTORY: Social History   Socioeconomic History   Marital status: Married    Spouse name: Not on file   Number of children: Not on file   Years of education: Not on file   Highest education level: Associate degree: occupational, Scientist, product/process development, or vocational program  Occupational History   Not on file  Tobacco Use   Smoking status: Former    Current packs/day: 0.00    Types: Cigarettes    Quit date: 04/05/2013    Years since quitting: 10.4   Smokeless tobacco: Never  Substance and Sexual Activity   Alcohol use: No   Drug use: No   Sexual activity: Yes    Partners: Female    Birth control/protection: None  Other Topics Concern   Not on file  Social History Narrative   Not on file   Social Determinants of Health   Financial Resource Strain: Low Risk  (03/07/2023)   Overall Financial Resource Strain (CARDIA)    Difficulty of Paying Living Expenses: Not hard at all  Food Insecurity: No Food Insecurity (03/07/2023)   Hunger Vital Sign    Worried About Running Out of Food in the Last Year: Never true    Ran Out of Food in the Last Year: Never true  Transportation Needs: No Transportation Needs (03/07/2023)   PRAPARE - Administrator, Civil Service (Medical): No    Lack of  Transportation (Non-Medical): No  Physical Activity: Insufficiently Active (03/07/2023)   Exercise Vital Sign    Days of Exercise per Week: 3 days    Minutes of Exercise per Session: 30 min  Stress: Stress Concern Present (03/07/2023)   [employer] of Occupational Health - Occupational Stress Questionnaire    Feeling of Stress : To some extent  Social Connections: Moderately Isolated (03/07/2023)   Social Connection and Isolation Panel [NHANES]    Frequency of Communication with Friends and Family: More than three times a week    Frequency of Social Gatherings with Friends and Family: Once a week    Attends Religious Services: Never    Database administrator or Organizations: No    Attends Banker Meetings: Not on file    Marital Status: Married  Intimate Partner Violence: Unknown (03/01/2022)   Received from [employer], [facility]   HITS    Physically Hurt:  Not on file    Insult or Talk Down To: Not on file    Threaten Physical Harm: Not on file    Scream or Curse: Not on file      Levert Feinstein, M.D. Ph.D.  [facility] [address] [place], [place] [address] Ph: [phone] Fax: [phone]  CC:  Richardean Sale, DO [address] [place],  [place] [address]  [name], DO

## 2023-10-02 ENCOUNTER — Telehealth: Payer: Self-pay | Admitting: Neurology

## 2023-10-02 ENCOUNTER — Ambulatory Visit (INDEPENDENT_AMBULATORY_CARE_PROVIDER_SITE_OTHER): Payer: 59 | Admitting: Neurology

## 2023-10-02 ENCOUNTER — Ambulatory Visit (INDEPENDENT_AMBULATORY_CARE_PROVIDER_SITE_OTHER): Payer: Self-pay | Admitting: Neurology

## 2023-10-02 VITALS — BP 136/92 | HR 70 | Ht 72.0 in | Wt 229.0 lb

## 2023-10-02 DIAGNOSIS — R202 Paresthesia of skin: Secondary | ICD-10-CM

## 2023-10-02 DIAGNOSIS — Z0289 Encounter for other administrative examinations: Secondary | ICD-10-CM

## 2023-10-02 NOTE — Telephone Encounter (Signed)
Error

## 2023-10-02 NOTE — Progress Notes (Signed)
Laboratory evaluation from [facility] rheumatology in Sept 2024.   CPK 99, aldolase 5.2, anti-Jo1 antibody less than 20,    Anti-OJ, anti-SRP, anti-MI-2, anti-TIF-1 gamma, anti-MDA-5, anti-NXP-2 antibody was negative,

## 2023-10-02 NOTE — Procedures (Signed)
Full Name: Devin Hoffman Gender: Male MRN #: [id number] Date of Birth: 1981/02/12    Visit Date: 10/02/2023 14:21 Age: 42 Years Examining Physician: Dr. Levert Feinstein Referring Physician: Dr. Levert Feinstein Height: 6 feet 0 inch History: 42 year old male complains of body achy pain  Summary of the test:  Nerve conduction study: Bilateral sural, superficial peroneal sensory responses were normal.  Right median, ulnar sensory responses were within normal limit.  Right median mixed response was 0.5 ms prolonged compared to ipsilateral ulnar mixed response.  Bilateral tibial, peroneal to EDB, right median and ulnar motor responses were normal.  Electromyography: Selected needle examination of right upper, lower extremity muscles, cervical and lumbosacral paraspinal muscles were normal.  Conclusion: This is a slight normal study.  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  There is no evidence of large fiber peripheral neuropathy, right cervical or lumbosacral radiculopathy.    ------------------------------- Levert Feinstein M.D. PhD  [facility] [address] [place], [place] [address] Tel: [phone] Fax: [phone]  Verbal informed consent was obtained from the patient, patient was informed of potential risk of procedure, including bruising, bleeding, hematoma formation, infection, muscle weakness, muscle pain, numbness, among others.        MNC    Nerve / Sites Muscle Latency Ref. Amplitude Ref. Rel Amp Segments Distance Velocity Ref. Area    ms ms mV mV %  cm m/s m/s mVms  R Median - APB     Wrist APB 3.0 <=4.4 8.4 >=4.0 100 Wrist - APB 7   31.8     Upper arm APB 7.2  8.4  99.4 Upper arm - Wrist 26 62 >=49 30.9  R Ulnar - ADM     Wrist ADM 2.5 <=3.3 10.6 >=6.0 100 Wrist - ADM 7   38.1     B.Elbow ADM 5.0  7.6  71.6 B.Elbow - Wrist 15 62 >=49 25.6     A.Elbow  ADM 8.1  10.3  135 A.Elbow - B.Elbow 16 52 >=49 35.6  R Peroneal - EDB     Ankle EDB 3.9 <=6.5 12.0 >=2.0 100 Ankle - EDB 9   38.3     Fib head EDB 10.5  11.2  93.7 Fib head - Ankle 34 51 >=44 37.0     Pop fossa EDB 12.5  10.6  94.3 Pop fossa - Fib head 12 58 >=44 34.6         Pop fossa - Ankle      L Peroneal - EDB     Ankle EDB 4.6 <=6.5 5.8 >=2.0 100 Ankle - EDB 9   18.6     Fib head EDB 10.6  5.6  96.9 Fib head - Ankle 31.6 53 >=44 19.4     Pop fossa EDB 12.3  5.6  99.5 Pop fossa - Fib head 11 65 >=44 19.1         Pop fossa - Ankle      R Tibial - AH     Ankle AH 3.6 <=5.8 11.8 >=4.0 100 Ankle - AH 9   38.1     Pop fossa AH 11.5  14.2  120 Pop fossa - Ankle 44 56 >=41 44.2  L Tibial - AH     Ankle AH 4.0 <=5.8 9.1 >=4.0 100 Ankle - AH 9   24.2     Pop fossa AH 11.8  12.5  138 Pop fossa - Ankle 42 54 >=41 36.1                 SNC    Nerve / Sites Rec. Site Peak Lat Ref.  Amp Ref. Segments Distance Peak Diff Ref.    ms ms V V  cm ms ms  R Sural - Ankle (Calf)     Calf Ankle 3.3 <=4.4 18 >=6 Calf - Ankle 14    L Sural - Ankle (Calf)     Calf Ankle 3.3 <=4.4 15 >=6 Calf - Ankle 14    R Superficial peroneal - Ankle     Lat leg Ankle 3.6 <=4.4 22 >=6 Lat leg - Ankle 14    L Superficial peroneal - Ankle     Lat leg Ankle 3.9 <=4.4 10 >=6 Lat leg - Ankle 14    R Median, Ulnar - Transcarpal comparison     Median Palm Wrist 2.2 <=2.2 39 >=35 Median Palm - Wrist 8       Ulnar Palm Wrist 1.7 <=2.2 20 >=12 Ulnar Palm - Wrist 8          Median Palm - Ulnar Palm  0.5 <=0.4  R Median - Orthodromic (Dig II, Mid palm)     Dig II Wrist 2.9 <=3.4 10 >=10 Dig II - Wrist 13    R Ulnar - Orthodromic, (Dig V, Mid palm)     Dig V Wrist 2.6 <=3.1 14 >=5 Dig V - Wrist 73                     F  Wave    Nerve F Lat Ref.   ms ms  R Tibial - AH 49.1 <=56.0  L Tibial - AH 47.7 <=56.0  R Ulnar - ADM 28.5 <=32.0           EMG Summary Table    Spontaneous MUAP Recruitment  Muscle IA Fib PSW Fasc  Other Amp Dur. Poly Pattern  R. Tibialis anterior Normal None None None _______ Normal Normal Normal Normal  R. Tibialis posterior Normal None None None _______ Normal Normal Normal Normal  R. Peroneus longus Normal None None None _______ Normal Normal Normal Normal  R. Gastrocnemius (Medial head) Normal None None None _______ Normal Normal Normal Normal  R. Vastus lateralis Normal None None None _______ Normal Normal Normal Normal  R. Lumbar paraspinals (low) Normal None None None _______ Normal Normal Normal Normal  R. Lumbar paraspinals (mid) Normal None None None _______ Normal Normal Normal Normal  R. First dorsal interosseous Normal None None None _______ Normal Normal Normal Normal  R. Pronator teres Normal None None None _______ Normal Normal Normal Normal  R. Biceps brachii Normal None None None _______ Normal Normal Normal Normal  R. Deltoid Normal None None None _______ Normal Normal Normal Normal  R. Cervical paraspinals Normal None None None _______ Normal Normal Normal Normal

## 2023-10-04 ENCOUNTER — Ambulatory Visit: Payer: 59 | Admitting: Family Medicine

## 2023-10-05 NOTE — Progress Notes (Signed)
EMG nerve conduction study report is under procedure tab 

## 2023-10-17 NOTE — Progress Notes (Signed)
[name] [address] [place] [address] Phone: [phone] Subjective:    I'm seeing this patient by the request  of:  [name], DO  CC: Hand and knee pain follow-up  [id number]  09/11/2023 Unknown cause at this time, we have replaced all deficiencies at the moment and continues to have worsening of the hands as well as somewhat of the legs now.  With that being more systemic we did discuss different treatment options and given a short course of prednisone.  If continuing to have difficulty do think nerve conduction testing would be beneficial.  Discussed the potential for an MRI of the brain to rule out such things as a demyelinating disease that could be contributing.  Has not had significant other difficulties such as vision changes.  Follow-up again in 6 to 8 weeks otherwise.     Update 10/28/2023 Devin Hoffman is a 42 y.o. male coming in with complaint of B hand and knee pain. Patient states had nerve conduction (go over), taking cymbalta and has been helping about 40-50%. Wants to go over MRI as well. Talk next steps.   MRI of the head was independently visualized by me showing nothing significantly abnormal that would be consistent with patient's symptoms.   Past Medical History:  Diagnosis Date   Recurrent depression (HCC)    Past Surgical History:  Procedure Laterality Date   HAND SURGERY Left    KNEE SURGERY     Social History   Socioeconomic History   Marital status: Married    Spouse name: Not on file   Number of children: Not on file   Years of education: Not on file   Highest education level: Associate degree: occupational, Scientist, product/process development, or vocational program  Occupational History   Not on file  Tobacco Use   Smoking status: Former    Current packs/day: 0.00    Types: Cigarettes    Quit date: 04/05/2013    Years since quitting: 10.5   Smokeless tobacco: Never  Substance and Sexual Activity    Alcohol use: No   Drug use: No   Sexual activity: Yes    Partners: Female    Birth control/protection: None  Other Topics Concern   Not on file  Social History Narrative   Not on file   Social Determinants of Health   Financial Resource Strain: Low Risk  (03/07/2023)   Overall Financial Resource Strain (CARDIA)    Difficulty of Paying Living Expenses: Not hard at all  Food Insecurity: No Food Insecurity (03/07/2023)   Hunger Vital Sign    Worried About Running Out of Food in the Last Year: Never true    Ran Out of Food in the Last Year: Never true  Transportation Needs: No Transportation Needs (03/07/2023)   PRAPARE - Administrator, Civil Service (Medical): No    Lack of Transportation (Non-Medical): No  Physical Activity: Insufficiently Active (03/07/2023)   Exercise Vital Sign    Days of Exercise per Week: 3 days    Minutes of Exercise per Session: 30 min  Stress: Stress Concern Present (03/07/2023)   [employer] of Occupational Health - Occupational Stress Questionnaire    Feeling of Stress : To some extent  Social Connections: Moderately Isolated (03/07/2023)   Social Connection and Isolation Panel [NHANES]    Frequency of Communication with Friends and Family: More than three times a week    Frequency of Social Gatherings with Friends and Family: Once  a week    Attends Religious Services: Never    Active Member of Clubs or Organizations: No    Attends Engineer, structural: Not on file    Marital Status: Married   No Known Allergies Family History  Problem Relation Age of Onset   Alcohol abuse Father    Gout Father    Autism spectrum disorder Brother    Colon cancer Maternal Grandmother    Pancreatic cancer Paternal Grandfather      Current Outpatient Medications (Cardiovascular):    EPINEPHrine (EPIPEN 2-PAK) 0.3 mg/0.3 mL IJ SOAJ injection, Inject 0.3 mg into the muscle as needed for anaphylaxis.     Current Outpatient Medications  (Other):    venlafaxine XR (EFFEXOR XR) 37.5 MG 24 hr capsule, Take 1 capsule (37.5 mg total) by mouth daily with breakfast.   amphetamine-dextroamphetamine (ADDERALL XR) 20 MG 24 hr capsule, Take 1 capsule (20 mg total) by mouth every morning.   DULoxetine (CYMBALTA) 30 MG capsule, TAKE 1 CAPSULE BY MOUTH EVERY DAY   Vitamin D, Ergocalciferol, (DRISDOL) 1.25 MG (50000 UNIT) CAPS capsule, TAKE 1 CAPSULE (50,000 UNITS TOTAL) BY MOUTH EVERY 7 (SEVEN) DAYS    Objective  Blood pressure 112/82, pulse 85, height 6' (1.829 m), weight 228 lb (103.4 kg), SpO2 98%.   General: No apparent distress alert and oriented x3 mood and affect normal, dressed appropriately.  HEENT: Pupils equal, extraocular movements intact  Respiratory: Patient's speak in full sentences and does not appear short of breath  Cardiovascular: No lower extremity edema, non tender, no erythema  Is sitting comfortably today.  And some mild discomfort in the back noted but nothing severe.    Impression and Recommendations:

## 2023-10-22 ENCOUNTER — Other Ambulatory Visit: Payer: Self-pay | Admitting: Neurology

## 2023-10-28 ENCOUNTER — Encounter: Payer: Self-pay | Admitting: Family Medicine

## 2023-10-28 ENCOUNTER — Ambulatory Visit (INDEPENDENT_AMBULATORY_CARE_PROVIDER_SITE_OTHER): Payer: 59 | Admitting: Family Medicine

## 2023-10-28 VITALS — BP 112/82 | HR 85 | Ht 72.0 in | Wt 228.0 lb

## 2023-10-28 DIAGNOSIS — G629 Polyneuropathy, unspecified: Secondary | ICD-10-CM | POA: Diagnosis not present

## 2023-10-28 MED ORDER — VENLAFAXINE HCL ER 37.5 MG PO CP24: 37.5000 mg | ORAL_CAPSULE | Freq: Every day | ORAL | 0 refills | Status: DC

## 2023-10-28 NOTE — Assessment & Plan Note (Signed)
Patient does have some carpal tunnel noted on the right side.  Does have though very intermittent paresthesia.  Has made some improvement with the Cymbalta but is concerned with some of the side effects.  Will change medication to Effexor.  Warned of potential side effects.  Patient will transition in 1 day with the change with that also being at a low dose.  Follow-up again in 3 months.

## 2023-10-28 NOTE — Patient Instructions (Addendum)
Effexor 37.5mg  prescribed Stop Cymbalta Send [place] a message in about 2-3 weeks with update See you again in 3 months

## 2023-11-19 ENCOUNTER — Other Ambulatory Visit: Payer: Self-pay | Admitting: Family Medicine

## 2023-11-19 MED ORDER — AMPHETAMINE-DEXTROAMPHET ER 20 MG PO CP24: 20.0000 mg | ORAL_CAPSULE | ORAL | 0 refills | Status: DC

## 2023-11-19 NOTE — Telephone Encounter (Signed)
Plz make sure he is scheduled for his CPE at the end of Feb. Ty.

## 2023-11-19 NOTE — Telephone Encounter (Signed)
Requesting: Adderall XR 20mg   Contract: 03/23/22 UDS: 03/23/22 Last Visit: 07/03/23 Next Visit:  None Last Refill: 01/22/23 #30 and 0RF   Please Advise

## 2023-12-02 ENCOUNTER — Ambulatory Visit: Payer: 59 | Admitting: Neurology

## 2023-12-10 ENCOUNTER — Other Ambulatory Visit: Payer: Self-pay

## 2023-12-10 ENCOUNTER — Encounter: Payer: Self-pay | Admitting: Family Medicine

## 2023-12-10 MED ORDER — VENLAFAXINE HCL ER 75 MG PO CP24: 75.0000 mg | ORAL_CAPSULE | Freq: Every day | ORAL | 0 refills | Status: DC

## 2023-12-16 ENCOUNTER — Encounter: Payer: 59 | Admitting: Internal Medicine

## 2024-01-05 ENCOUNTER — Other Ambulatory Visit: Payer: Self-pay | Admitting: Family Medicine

## 2024-01-12 ENCOUNTER — Other Ambulatory Visit: Payer: Self-pay | Admitting: Family Medicine

## 2024-01-17 NOTE — Progress Notes (Signed)
 [name] [address] [place] [address] Phone: [phone] Subjective:    I'm seeing this patient by the request  of:  [name], DO  CC: Neuropathy and pain follow-up  [id number]  09/11/2023 Unknown cause at this time, we have replaced all deficiencies at the moment and continues to have worsening of the hands as well as somewhat of the legs now. With that being more systemic we did discuss different treatment options and given a short course of prednisone. If continuing to have difficulty do think nerve conduction testing would be beneficial. Discussed the potential for an MRI of the brain to rule out such things as a demyelinating disease that could be contributing. Has not had significant other difficulties such as vision changes. Follow-up again in 6 to 8 weeks otherwise.   Update 01/28/2024 Devin Hoffman is a 43 y.o. male coming in with complaint of neuropathy.  Patient was progressing and seem to be doing better with the Effexor so increased to 75 mg.  Patient states  mediation didn't really help. Cymbalta has helped more. Symptoms are intermittent    Nerve conduction study in November showed very mild right carpal tunnel syndrome otherwise normal.  Previous laboratory workup did show a very low vitamin D, and uric acid has been corrected.  Past Medical History:  Diagnosis Date   Recurrent depression (HCC)    Past Surgical History:  Procedure Laterality Date   HAND SURGERY Left    KNEE SURGERY     Social History   Socioeconomic History   Marital status: Married    Spouse name: Not on file   Number of children: Not on file   Years of education: Not on file   Highest education level: Associate degree: occupational, Scientist, product/process development, or vocational program  Occupational History   Not on file  Tobacco Use   Smoking status: Former    Current packs/day: 0.00    Types: Cigarettes    Quit date: 04/05/2013    Years since  quitting: 10.8   Smokeless tobacco: Never  Substance and Sexual Activity   Alcohol use: No   Drug use: No   Sexual activity: Yes    Partners: Female    Birth control/protection: None  Other Topics Concern   Not on file  Social History Narrative   Not on file   Social Drivers of Health   Financial Resource Strain: Low Risk  (03/07/2023)   Overall Financial Resource Strain (CARDIA)    Difficulty of Paying Living Expenses: Not hard at all  Food Insecurity: No Food Insecurity (03/07/2023)   Hunger Vital Sign    Worried About Running Out of Food in the Last Year: Never true    Ran Out of Food in the Last Year: Never true  Transportation Needs: No Transportation Needs (03/07/2023)   PRAPARE - Administrator, Civil Service (Medical): No    Lack of Transportation (Non-Medical): No  Physical Activity: Insufficiently Active (03/07/2023)   Exercise Vital Sign    Days of Exercise per Week: 3 days    Minutes of Exercise per Session: 30 min  Stress: Stress Concern Present (03/07/2023)   [employer] of Occupational Health - Occupational Stress Questionnaire    Feeling of Stress : To some extent  Social Connections: Moderately Isolated (03/07/2023)   Social Connection and Isolation Panel [NHANES]    Frequency of Communication with Friends and Family: More than three times a week    Frequency of  Social Gatherings with Friends and Family: Once a week    Attends Religious Services: Never    Database administrator or Organizations: No    Attends Engineer, structural: Not on file    Marital Status: Married   No Known Allergies Family History  Problem Relation Age of Onset   Alcohol abuse Father    Gout Father    Autism spectrum disorder Brother    Colon cancer Maternal Grandmother    Pancreatic cancer Paternal Grandfather      Current Outpatient Medications (Cardiovascular):    EPINEPHrine (EPIPEN 2-PAK) 0.3 mg/0.3 mL IJ SOAJ injection, Inject 0.3 mg into the  muscle as needed for anaphylaxis.     Current Outpatient Medications (Other):    amphetamine-dextroamphetamine (ADDERALL XR) 20 MG 24 hr capsule, Take 1 capsule (20 mg total) by mouth every morning.   venlafaxine XR (EFFEXOR-XR) 75 MG 24 hr capsule, TAKE 1 CAPSULE BY MOUTH DAILY WITH BREAKFAST.   Vitamin D, Ergocalciferol, (DRISDOL) 1.25 MG (50000 UNIT) CAPS capsule, TAKE 1 CAPSULE (50,000 UNITS TOTAL) BY MOUTH EVERY 7 (SEVEN) DAYS   Reviewed prior external information including notes and imaging from  primary care provider As well as notes that were available from care everywhere and other healthcare systems.  Past medical history, social, surgical and family history all reviewed in electronic medical record.  No pertanent information unless stated regarding to the chief complaint.   Review of Systems:  No headache, visual changes, nausea, vomiting, diarrhea, constipation, dizziness, abdominal pain, skin rash, fevers, chills, night sweats, weight loss, swollen lymph nodes, body aches, joint swelling, chest pain, shortness of breath, mood changes. POSITIVE muscle aches  Objective  Blood pressure 110/82, pulse 76, height 6' (1.829 m), weight 237 lb (107.5 kg), SpO2 98%.   General: No apparent distress alert and oriented x3 mood and affect normal, dressed appropriately.  HEENT: Pupils equal, extraocular movements intact  Respiratory: Patient's speak in full sentences and does not appear short of breath  Cardiovascular: No lower extremity edema, non tender, no erythema  Neck exam does have some loss lordosis noted.  Some tenderness to palpation noted.  Tightness with the neck as well.  Osteopathic findings C2 flexed rotated and side bent right C6 flexed rotated and side bent left T3 extended rotated and side bent right inhaled third rib T9 extended rotated and side bent left L2 flexed rotated and side bent right L3 flexed rotated and side bent left Sacrum right on right      Impression and Recommendations:    Lumbar radiculopathy Discussed icing regimen and home exercises, which activities to do and which ones to avoid.  Responded extremely well to osteopathic manipulation.  We discussed with patient about different medications and is back on the Cymbalta.  Patient will write if he thinks he needs to increase to up to 60 mg.  Patient will increase activity slowly otherwise.    Decision today to treat with OMT was based on Physical Exam  After verbal consent patient was treated with HVLA, ME, FPR techniques in cervical, thoracic, rib, lumbar and sacral areas, all areas are chronic   Patient tolerated the procedure well with improvement in symptoms  Patient given exercises, stretches and lifestyle modifications  See medications in patient instructions if given  Patient will follow up in 4-8 weeks The above documentation has been reviewed and is accurate and complete Judi Saa, DO

## 2024-01-28 ENCOUNTER — Ambulatory Visit: Payer: 59 | Admitting: Family Medicine

## 2024-01-28 ENCOUNTER — Encounter: Payer: Self-pay | Admitting: Family Medicine

## 2024-01-28 VITALS — BP 110/82 | HR 76 | Ht 72.0 in | Wt 237.0 lb

## 2024-01-28 DIAGNOSIS — M9903 Segmental and somatic dysfunction of lumbar region: Secondary | ICD-10-CM

## 2024-01-28 DIAGNOSIS — M5416 Radiculopathy, lumbar region: Secondary | ICD-10-CM

## 2024-01-28 DIAGNOSIS — M9904 Segmental and somatic dysfunction of sacral region: Secondary | ICD-10-CM | POA: Diagnosis not present

## 2024-01-28 DIAGNOSIS — M9901 Segmental and somatic dysfunction of cervical region: Secondary | ICD-10-CM | POA: Diagnosis not present

## 2024-01-28 DIAGNOSIS — M9908 Segmental and somatic dysfunction of rib cage: Secondary | ICD-10-CM | POA: Diagnosis not present

## 2024-01-28 DIAGNOSIS — M9902 Segmental and somatic dysfunction of thoracic region: Secondary | ICD-10-CM | POA: Diagnosis not present

## 2024-01-28 NOTE — Patient Instructions (Addendum)
 Good to see you. Hep. Avoid overhead lifting.  Can increase Cymbalta if needed. Return in 6 to 8 weeks.

## 2024-01-28 NOTE — Assessment & Plan Note (Signed)
 Discussed icing regimen and home exercises, which activities to do and which ones to avoid.  Responded extremely well to osteopathic manipulation.  We discussed with patient about different medications and is back on the Cymbalta.  Patient will write if he thinks he needs to increase to up to 60 mg.  Patient will increase activity slowly otherwise.

## 2024-02-14 ENCOUNTER — Encounter: Payer: Self-pay | Admitting: Family Medicine

## 2024-02-17 ENCOUNTER — Other Ambulatory Visit: Payer: Self-pay

## 2024-02-17 MED ORDER — CELECOXIB 100 MG PO CAPS: 100.0000 mg | ORAL_CAPSULE | Freq: Two times a day (BID) | ORAL | 0 refills | Status: DC

## 2024-03-02 ENCOUNTER — Ambulatory Visit (INDEPENDENT_AMBULATORY_CARE_PROVIDER_SITE_OTHER): Admitting: Family Medicine

## 2024-03-02 VITALS — BP 122/70 | HR 75 | Ht 72.0 in | Wt 237.0 lb

## 2024-03-02 DIAGNOSIS — M7711 Lateral epicondylitis, right elbow: Secondary | ICD-10-CM | POA: Diagnosis not present

## 2024-03-02 DIAGNOSIS — G479 Sleep disorder, unspecified: Secondary | ICD-10-CM | POA: Diagnosis not present

## 2024-03-02 DIAGNOSIS — R202 Paresthesia of skin: Secondary | ICD-10-CM | POA: Diagnosis not present

## 2024-03-02 NOTE — Progress Notes (Signed)
 03/03/24- 42 yoM for sleep evaluation with concern of OSA and RLS. Medical problem list includes  Neuropathy, Lumbar Radiculopathy, Depression, ADHD, -Venlafexine, Cymbalta , Adderall XR 20. Epworth score-7 Body weight today 239 lbs -----Pain in hands and feet.  A lot of movement in arms and legs. Question if RLS or seizures per Dr. Walterine Gunther No sleep med, 3 cups AM coffee. No ENT surgery. No heart or lung disease. Says he "twitches' in arms and legs esp when trying to sleep. Iron levels tested ok. Discussed the use of AI scribe software for clinical note transcription with the patient, who gave verbal consent to proceed.  History of Present Illness   The patient, with a history of restless sleep and daytime fatigue, presents after a year and a half of trying various treatments. He reports that his symptoms began with foot pain, which then progressed to hand pain. He has undergone neurological tests and has been taken off various medications, including cholesterol medication, Prozac , and Adderall, with no change in symptoms. He has also undergone a nerve conductivity test, which showed no abnormalities. Recently, his wife recorded him at night and noticed that he was restless and twitching. He has also started snoring, which is a new symptom. He reports no association of limb movements with dreams and is able to fall asleep easily He does not describe restless crawling sensation in legs preventing sleep onset. Devin Hoffman However, he does report a need to move his legs if he feels restricted, such as by a heavy blanket .He does not describe complex parasomnias such as punching, kicking or attacking bed partner. He denies any family history of sleep problems, dementia, Alzheimer's, Lewy body dementia, or Parkinson's disease, but does report a sister with schizophrenia. He consumes three cups of coffee in the morning and denies any surgery in the nose or throat, or any known heart or lung conditions. He reports  feeling exhausted by 5pm and has stopped exercising in the past six months due to this fatigue. He denies any significant anemia as an adult.     Prior to Admission medications   Medication Sig Start Date End Date Taking? Authorizing Provider  amphetamine -dextroamphetamine (ADDERALL XR) 20 MG 24 hr capsule Take 1 capsule (20 mg total) by mouth every morning. 11/19/23  Yes Jobe Mulder, DO  EPINEPHrine  (EPIPEN  2-PAK) 0.3 mg/0.3 mL IJ SOAJ injection Inject 0.3 mg into the muscle as needed for anaphylaxis. 03/07/23  Yes Minna Amass B, NP  Vitamin D , Ergocalciferol , (DRISDOL ) 1.25 MG (50000 UNIT) CAPS capsule TAKE 1 CAPSULE (50,000 UNITS TOTAL) BY MOUTH EVERY 7 (SEVEN) DAYS 01/06/24  Yes Ronnell Coins M, DO  celecoxib  (CELEBREX ) 100 MG capsule TAKE 1 CAPSULE BY MOUTH TWICE A DAY 03/16/24   Isidro Margo, DO   Past Medical History:  Diagnosis Date   Recurrent depression [facility])    Past Surgical History:  Procedure Laterality Date   HAND SURGERY Left    KNEE SURGERY     Family History  Problem Relation Age of Onset   Alcohol abuse Father    Gout Father    Autism spectrum disorder Brother    Colon cancer Maternal Grandmother    Pancreatic cancer Paternal Grandfather    Social History   Socioeconomic History   Marital status: Married    Spouse name: Not on file   Number of children: Not on file   Years of education: Not on file   Highest education level: Associate degree: occupational, Scientist, product/process development, or vocational program  Occupational History   Not on file  Tobacco Use   Smoking status: Former    Current packs/day: 0.00    Types: Cigarettes    Quit date: 2004    Years since quitting: 21.3    Passive exposure: Never   Smokeless tobacco: Never  Substance and Sexual Activity   Alcohol use: No   Drug use: No   Sexual activity: Yes    Partners: Female    Birth control/protection: None  Other Topics Concern   Not on file  Social History Narrative   Not on file    Social Drivers of Health   Financial Resource Strain: Low Risk  (03/07/2023)   Overall Financial Resource Strain (CARDIA)    Difficulty of Paying Living Expenses: Not hard at all  Food Insecurity: No Food Insecurity (03/07/2023)   Hunger Vital Sign    Worried About Running Out of Food in the Last Year: Never true    Ran Out of Food in the Last Year: Never true  Transportation Needs: No Transportation Needs (03/07/2023)   PRAPARE - Administrator, Civil Service (Medical): No    Lack of Transportation (Non-Medical): No  Physical Activity: Insufficiently Active (03/07/2023)   Exercise Vital Sign    Days of Exercise per Week: 3 days    Minutes of Exercise per Session: 30 min  Stress: Stress Concern Present (03/07/2023)   [employer] of Occupational Health - Occupational Stress Questionnaire    Feeling of Stress : To some extent  Social Connections: Moderately Isolated (03/07/2023)   Social Connection and Isolation Panel [NHANES]    Frequency of Communication with Friends and Family: More than three times a week    Frequency of Social Gatherings with Friends and Family: Once a week    Attends Religious Services: Never    Database administrator or Organizations: No    Attends Engineer, structural: Not on file    Marital Status: Married  Intimate Partner Violence: Unknown (03/01/2022)   Received from [employer], [facility]   HITS    Physically Hurt: Not on file    Insult or Talk Down To: Not on file    Threaten Physical Harm: Not on file    Scream or Curse: Not on file   Assessment and Plan:    Sleep disturbances Experiences twitching, kicking, snoring, leading to fatigue. Differential includes benign hypnic jerks,  REM behavior disorder, restless leg syndrome, nocturnal seizures and partial arousals related to apnea events.. Previous neurological evaluations unremarkable. Recommended sleep study for further evaluation. - Order overnight sleep study at  sleep disorder center to evaluate for sleep disturbances, including nocturnal seizures and sleep apnea. - Ensure sleep study includes EEG leads for brain wave and muscle movement monitoring. - Advise avoidance of napping on study day and maintenance of usual medication routines. - Instruct preparation for overnight stay at sleep disorder center. - Explained sleep study involves no painful procedures and concludes by early morning.     ROS-see HPI   + = positive Constitutional:    weight loss, night sweats, fevers, chills, fatigue, lassitude. HEENT:    headaches, difficulty swallowing, tooth/dental problems, sore throat,       sneezing, itching, ear ache, nasal congestion, post nasal drip, snoring CV:    chest pain, orthopnea, PND, swelling in lower extremities, anasarca,  ith exertion or at rest.                productive cough,   non-productive cough, coughing up of blood.              change in color of mucus.  wheezing.   Skin:    rash or lesions. GI:  No-   heartburn, indigestion, abdominal pain, nausea, vomiting, diarrhea,                 change in bowel habits, loss of appetite GU: dysuria, change in color of urine, no urgency or frequency.   flank pain. MS:   joint pain,+ stiffness, decreased range of motion, back pain. Neuro-     nothing unusual Psych:  change in mood or affect.  depression or +anxiety.   memory loss.  OBJ- Physical Exam General- Alert, Oriented, Affect-appropriate, Distress- none acute Skin- rash-none, lesions- none, excoriation- none Lymphadenopathy- none Head- atraumatic            Eyes- Gross vision intact, PERRLA, conjunctivae and secretions clear            Ears- Hearing, canals-normal            Nose- Clear, no-Septal dev, mucus, polyps, erosion, perforation             Throat- Mallampati II , mucosa clear , drainage- none, tonsils- atrophic, +teeth Neck- flexible , trachea midline, no stridor , thyroid  nl, carotid no  bruit Chest - symmetrical excursion , unlabored           Heart/CV- RRR , no murmur , no gallop  , no rub, nl s1 s2                           - JVD- none , edema- none, stasis changes- none, varices- none           Lung- clear to P&A, wheeze- none, cough- none , dullness-none, rub- none           Chest wall-  Abd-  Br/ Gen/ Rectal- Not done, not indicated Extrem- cyanosis- none, clubbing, none, atrophy- none, strength- nl Neuro- grossly intact to observation

## 2024-03-02 NOTE — Patient Instructions (Addendum)
 Injection in elbow Pulmonology will call you to schedule sleep study See me ion 2-3 months

## 2024-03-02 NOTE — Progress Notes (Unsigned)
  [name] [address] [place] [address] Phone: [phone] Subjective:    I'm seeing this patient by the request  of:  [name], DO  CC:   [id number]  Devin Hoffman is a 43 y.o. male coming in with complaint of back and neck pain. OMT 01/28/2024. Patient states   Medications patient has been prescribed: Celebrex, Effexor, Vit D  Taking:         Reviewed prior external information including notes and imaging from previsou exam, outside providers and external EMR if available.   As well as notes that were available from care everywhere and other healthcare systems.  Past medical history, social, surgical and family history all reviewed in electronic medical record.  No pertanent information unless stated regarding to the chief complaint.   Past Medical History:  Diagnosis Date   Recurrent depression (HCC)     No Known Allergies   Review of Systems:  No headache, visual changes, nausea, vomiting, diarrhea, constipation, dizziness, abdominal pain, skin rash, fevers, chills, night sweats, weight loss, swollen lymph nodes, body aches, joint swelling, chest pain, shortness of breath, mood changes. POSITIVE muscle aches  Objective  There were no vitals taken for this visit.   General: No apparent distress alert and oriented x3 mood and affect normal, dressed appropriately.  HEENT: Pupils equal, extraocular movements intact  Respiratory: Patient's speak in full sentences and does not appear short of breath  Cardiovascular: No lower extremity edema, non tender, no erythema  Gait MSK:  Back   Osteopathic findings  C2 flexed rotated and side bent right C6 flexed rotated and side bent left T3 extended rotated and side bent right inhaled rib T9 extended rotated and side bent left L2 flexed rotated and side bent right Sacrum right on right       Assessment and Plan:  No problem-specific Assessment & Plan  notes found for this encounter.    Nonallopathic problems  Decision today to treat with OMT was based on Physical Exam  After verbal consent patient was treated with HVLA, ME, FPR techniques in cervical, rib, thoracic, lumbar, and sacral  areas  Patient tolerated the procedure well with improvement in symptoms  Patient given exercises, stretches and lifestyle modifications  See medications in patient instructions if given  Patient will follow up in 4-8 weeks             Note: This dictation was prepared with Dragon dictation along with smaller phrase technology. Any transcriptional errors that result from this process are unintentional.

## 2024-03-02 NOTE — Progress Notes (Unsigned)
 [facility] [address] [place] [address] Phone: [phone] Subjective:    I'm seeing this patient by the request  of:  Devin Dory, DO  CC: Elbow pain, all over pain  BJY:[id number]   01/28/2024 Discussed icing regimen and home exercises, which activities to do and which ones to avoid.  Responded extremely well to osteopathic manipulation.  We discussed with patient about different medications and is back on the Cymbalta.  Patient will write if he thinks he needs to increase to up to 60 mg.  Patient will increase activity slowly otherwise.     Update: 03/02/2024 Devin Hoffman is a 43 y.o. male coming in with complaint of elbow pain. Medication that was prescribed hasn't really worked. The pain is inconsistent. Currently in pain today.  Patient states continues to have difficulty.  Continues to have the paresthesia, continues to have the significant fatigue.  Brings in a video today of him sleeping.  Shows that he does have some difficulty and seems to be having some spasm of the lower extremity as well as the upper extremity.      Past Medical History:  Diagnosis Date   Recurrent depression (HCC)    Past Surgical History:  Procedure Laterality Date   HAND SURGERY Left    KNEE SURGERY     Social History   Socioeconomic History   Marital status: Married    Spouse name: Not on file   Number of children: Not on file   Years of education: Not on file   Highest education level: Associate degree: occupational, Scientist, product/process development, or vocational program  Occupational History   Not on file  Tobacco Use   Smoking status: Former    Current packs/day: 0.00    Types: Cigarettes    Quit date: 04/05/2013    Years since quitting: 10.9   Smokeless tobacco: Never  Substance and Sexual Activity   Alcohol use: No   Drug use: No   Sexual activity: Yes    Partners: Female    Birth control/protection: None  Other Topics Concern   Not on file   Social History Narrative   Not on file   Social Drivers of Health   Financial Resource Strain: Low Risk  (03/07/2023)   Overall Financial Resource Strain (CARDIA)    Difficulty of Paying Living Expenses: Not hard at all  Food Insecurity: No Food Insecurity (03/07/2023)   Hunger Vital Sign    Worried About Running Out of Food in the Last Year: Never true    Ran Out of Food in the Last Year: Never true  Transportation Needs: No Transportation Needs (03/07/2023)   PRAPARE - Administrator, Civil Service (Medical): No    Lack of Transportation (Non-Medical): No  Physical Activity: Insufficiently Active (03/07/2023)   Exercise Vital Sign    Days of Exercise per Week: 3 days    Minutes of Exercise per Session: 30 min  Stress: Stress Concern Present (03/07/2023)   Devin Hoffman of Occupational Health - Occupational Stress Questionnaire    Feeling of Stress : To some extent  Social Connections: Moderately Isolated (03/07/2023)   Social Connection and Isolation Panel [NHANES]    Frequency of Communication with Friends and Family: More than three times a week    Frequency of Social Gatherings with Friends and Family: Once a week    Attends Religious Services: Never    Database administrator or Organizations: No    Attends [employer]  or Organization Meetings: Not on file    Marital Status: Married   No Known Allergies Family History  Problem Relation Age of Onset   Alcohol abuse Father    Gout Father    Autism spectrum disorder Brother    Colon cancer Maternal Grandmother    Pancreatic cancer Paternal Grandfather      Current Outpatient Medications (Cardiovascular):    EPINEPHrine (EPIPEN 2-PAK) 0.3 mg/0.3 mL IJ SOAJ injection, Inject 0.3 mg into the muscle as needed for anaphylaxis.   Current Outpatient Medications (Analgesics):    celecoxib (CELEBREX) 100 MG capsule, Take 1 capsule (100 mg total) by mouth 2 (two) times daily.   Current Outpatient Medications (Other):     amphetamine-dextroamphetamine (ADDERALL XR) 20 MG 24 hr capsule, Take 1 capsule (20 mg total) by mouth every morning.   Vitamin D, Ergocalciferol, (DRISDOL) 1.25 MG (50000 UNIT) CAPS capsule, TAKE 1 CAPSULE (50,000 UNITS TOTAL) BY MOUTH EVERY 7 (SEVEN) DAYS   venlafaxine XR (EFFEXOR-XR) 75 MG 24 hr capsule, TAKE 1 CAPSULE BY MOUTH DAILY WITH BREAKFAST.   Reviewed prior external information including notes and imaging from  primary care provider As well as notes that were available from care everywhere and other healthcare systems.  Past medical history, social, surgical and family history all reviewed in electronic medical record.  No pertanent information unless stated regarding to the chief complaint.   Review of Systems:  No headache, visual changes, nausea, vomiting, diarrhea, constipation, dizziness, abdominal pain, skin rash, fevers, chills, night sweats, weight loss, swollen lymph nodes,, joint swelling, chest pain, shortness of breath, mood changes. POSITIVE muscle aches, fatigue, body aches  Objective  Blood pressure 122/70, pulse 75, height 6' (1.829 m), weight 237 lb (107.5 kg), SpO2 98%.   General: No apparent distress alert and oriented x3 mood and affect normal, dressed appropriately.  HEENT: Pupils equal, extraocular movements intact  Respiratory: Patient's speak in full sentences and does not appear short of breath  Cardiovascular: No lower extremity edema, non tender, no erythema  Right elbow shows severe tenderness over the lateral epicondylar area.  Worsening pain with resisted extension of the wrist.  Good range of motion of the elbow otherwise   After verbal consent patient was prepped with alcohol swab and with a 25-gauge 1 inch needle injected into the common extensor tendon near the lateral epicondylar area.  Total of 0.5 cc of 0.5% Marcaine and 0.5 cc of Kenalog 40 mg/mL used.  No blood loss.  Band-Aid placed.  Postinjection instructions given    Impression and  Recommendations:     The above documentation has been reviewed and is accurate and complete Judi Saa, DO

## 2024-03-03 ENCOUNTER — Ambulatory Visit (INDEPENDENT_AMBULATORY_CARE_PROVIDER_SITE_OTHER): Admitting: Internal Medicine

## 2024-03-03 ENCOUNTER — Encounter: Payer: Self-pay | Admitting: Family Medicine

## 2024-03-03 ENCOUNTER — Encounter: Payer: Self-pay | Admitting: Internal Medicine

## 2024-03-03 VITALS — BP 131/79 | HR 84 | Temp 98.4°F | Ht 72.0 in | Wt 239.6 lb

## 2024-03-03 DIAGNOSIS — G4761 Periodic limb movement disorder: Secondary | ICD-10-CM | POA: Diagnosis not present

## 2024-03-03 DIAGNOSIS — R569 Unspecified convulsions: Secondary | ICD-10-CM

## 2024-03-03 DIAGNOSIS — R0683 Snoring: Secondary | ICD-10-CM | POA: Diagnosis not present

## 2024-03-03 DIAGNOSIS — R5383 Other fatigue: Secondary | ICD-10-CM | POA: Diagnosis not present

## 2024-03-03 DIAGNOSIS — M7711 Lateral epicondylitis, right elbow: Secondary | ICD-10-CM | POA: Insufficient documentation

## 2024-03-03 DIAGNOSIS — Z87891 Personal history of nicotine dependence: Secondary | ICD-10-CM

## 2024-03-03 NOTE — Assessment & Plan Note (Signed)
 Bilateral paresthesias intermittently of the hands and feet.  We have been working on supplement especially at the vitamin D and significantly low initially.  Patient did bring in a video that did show that patient is having some tremors it appears while he was sleeping.  Will send for sleep study for further evaluation.  Depending on findings there this could change medical management.  Could be causing more muscle fatigue follow-up again in 6 to 8 weeks

## 2024-03-03 NOTE — Patient Instructions (Signed)
 Order- schedule incenter split night sleep study with seizure montage   dx snoring, limb movement, suspected seizure activity  Please call me about 2 weeks after your sleep test for result and recommendations

## 2024-03-03 NOTE — Assessment & Plan Note (Signed)
 Injection given today and tolerated the procedure well, discussed icing regimen and home exercises, discussed which activities to do and which ones to avoid such as overhead lifting.  Patient should make improvement and follow-up again in 6 to 8 weeks.

## 2024-03-12 ENCOUNTER — Encounter (HOSPITAL_BASED_OUTPATIENT_CLINIC_OR_DEPARTMENT_OTHER): Payer: Self-pay

## 2024-03-12 ENCOUNTER — Encounter (HOSPITAL_BASED_OUTPATIENT_CLINIC_OR_DEPARTMENT_OTHER): Admitting: Internal Medicine

## 2024-03-16 ENCOUNTER — Ambulatory Visit: Admitting: Family Medicine

## 2024-03-16 ENCOUNTER — Other Ambulatory Visit: Payer: Self-pay | Admitting: Family Medicine

## 2024-03-25 ENCOUNTER — Ambulatory Visit: Admitting: Family Medicine

## 2024-04-09 ENCOUNTER — Ambulatory Visit: Admitting: Family Medicine

## 2024-04-10 ENCOUNTER — Ambulatory Visit (HOSPITAL_BASED_OUTPATIENT_CLINIC_OR_DEPARTMENT_OTHER): Attending: Internal Medicine | Admitting: Internal Medicine

## 2024-04-10 VITALS — Ht 72.0 in | Wt 239.0 lb

## 2024-04-10 DIAGNOSIS — G4733 Obstructive sleep apnea (adult) (pediatric): Secondary | ICD-10-CM | POA: Diagnosis not present

## 2024-04-10 DIAGNOSIS — R569 Unspecified convulsions: Secondary | ICD-10-CM | POA: Diagnosis not present

## 2024-04-10 DIAGNOSIS — R0683 Snoring: Secondary | ICD-10-CM | POA: Insufficient documentation

## 2024-04-10 DIAGNOSIS — G4761 Periodic limb movement disorder: Secondary | ICD-10-CM | POA: Diagnosis not present

## 2024-04-18 DIAGNOSIS — R0683 Snoring: Secondary | ICD-10-CM

## 2024-04-18 NOTE — Procedures (Signed)
 Maryan Smalling [facility] [address] [place], [place] [address] Tel: [phone]   Fax: [phone]  Polysomnography Interpretation  Patient Name:  Devin Hoffman, Devin Hoffman Date:  04/10/2024 Referring Physician:  [employer]  Indications for Polysomnography The patient is a 43 year old Male who is 6' and weighs 239.0 lbs. His BMI equals 32.7.  A full night polysomnogram was performed to evaluate for -OSA.  Medication none taken  No Data.   Polysomnogram Data A full night polysomnogram recorded the standard physiologic parameters including EEG, EOG, EMG, EKG, nasal and oral airflow.  Respiratory parameters of chest and abdominal movements were recorded with Respiratory Inductance Plethysmography belts.  Oxygen saturation was recorded by pulse oximetry.   Sleep Architecture The total recording time of the polysomnogram was 388.6 minutes.  The total sleep time was 314.0 minutes.  The patient spent 1.0% of total sleep time in Stage N1, 64.6% in Stage N2, 16.9% in Stages N3, and 17.5% in REM.  Sleep latency was 13.3 minutes.  REM latency was 96.0 minutes.  Sleep Efficiency was 80.8%.  Wake after Sleep Onset time was 60.5 minutes.  Respiratory Events The polysomnogram revealed a presence of - obstructive, 3 centrals, and - mixed apneas resulting in an Apnea index of 5.4 events per hour.  There were 11 hypopneas (>=3% desaturation and/or arousal) resulting in an Apnea\Hypopnea Index (AHI >=3% desaturation and/or arousal) of 7.5 events per hour.  There were 5 hypopneas (>=4% desaturation) resulting in an Apnea\Hypopnea Index (AHI >=4% desaturation) of 6.3 events per hour.  There were 1 Respiratory Effort Related Arousals resulting in a RERA index of 0.2 events per hour. The Respiratory Disturbance Index is 7.6 events per hour.  The snore index was - events per hour.  Mean oxygen saturation was 94.9%.  The lowest oxygen saturation during sleep was 86.0%.  Time  spent <=88% oxygen saturation was 1.1 minutes (0.3%).  End Tidal CO2 during sleep ranged from - to - mmHg. End Tidal CO2 was greater than 50 mmHg for - minutes and greater than 55 mmHg for - minutes.  Limb Activity There were 143 total limb movements recorded, of this total, 131 were classified as PLMs.  PLM index was 25.0 per hour and PLM associated with Arousals index was 0.6 per hour.  Cardiac Summary The average pulse rate was 70.5 bpm.  The minimum pulse rate was 50.0 bpm while the maximum pulse rate was 100.0 bpm.  Cardiac rhythm was normal.  Comment: Mild obstructive sleep apnea, AHI (3%) 7.5/hr. Snoring with oxygen desaturation to a nadir of 86%, mean 94%.  Diagnosis: Obstructive sleep apnea  Recommendations: Depending on clinical consideration of symptoms and co-morbidity, conservative management, CPAP or a fitted oral appliance may be appropriate.   This study was personally reviewed and electronically signed by: Rosa College  MD Accredited Board Certified in Sleep Medicine Date/Time: 04/18/24    12:04        Diagnostic PSG Report  Patient Name: Devin Hoffman, Devin Hoffman Date: 04/10/2024  Date of Birth: 12/15/1980 Study Type: Diagnostic  Age: 43 year MRN #: [id number]  Sex: Male Interpreting Physician: Rosa [employer] [id number]  Height: 6' Referring Physician: Rosa [employer] MD  Weight: 239.0 lbs Recording Tech: Adel Holt McConnico RPSGT RST  BMI: 32.7 Scoring Tech: Yvonne McConnico RPSGT RST  ESS: 6 Neck Size: 17.5   Study Overview  Lights Off: 10:17:27 PM  Count Index  Lights On: 04:46:06 AM Awakenings: 17 3.2  Time in Bed: 388.6 min.  Arousals: 11 2.1  Total Sleep Time: 314.0 min. AHI (>=3% Desat and/or Ar.): 39 7.5   Sleep Efficiency: 80.8% AHI (>=4% Desat): 33 6.3   Sleep Latency: 13.3 min. Limb Movements: 143 27.3  Wake After Sleep Onset: 60.5 min. Snore: - -  REM Latency from Sleep Onset: 96.0 min. Desaturations: 63 12.0     Minimum SpO2 TST: 86.0%     Sleep Architecture  % of Time in Bed Stages Time (mins) % Sleep Time  Wake 74.0   Stage N1 3.0 1.0%  Stage N2 203.0 64.6%  Stage N3 53.0 16.9%  REM 55.0 17.5%   Arousal Summary   NREM REM Sleep Index  Respiratory Arousals - 1 1 0.2  PLM Arousals 3 - 3 0.6  Isolated Limb Movement Arousals 3 - 3 0.6  Snore Arousals - - - -  Spontaneous Arousals 4 - 4 0.8  Total 10 1 11  2.1   Limb Movement Summary   Count Index  Isolated Limb Movements 12 2.3  Periodic Limb Movements (PLMs) 131 25.0  Total Limb Movements 143 27.3    Respiratory Summary   By Sleep Stage By Body Position Total   NREM REM Supine Non-Supine   Time (min) 259.0 55.0 166.5 147.5 314.0         Obstructive Apnea - - - - -  Mixed Apnea - - - - -  Central Apnea 3 - 1 2 3   Total Apneas 6 22 26 2 28   Total Apnea Index 1.4 24.0 9.4 0.8 5.4         Hypopneas (>=3% Desat and/or Ar.) 7 4 4 7 11   AHI (>=3% Desat and/or Ar.) 3.0 28.4 10.8 3.7 7.5         Hypopneas (>=4% Desat) 2 3 3 2 5   AHI (>=4% Desat) 1.9 27.3 10.5 1.6 6.3          RERAs 1 - - 1 1  RERA Index 0.2 - - 0.4 0.2         RDI 3.2 28.4 10.8 4.1 7.6    Respiratory Event Type Index  Central Apneas 0.6  Obstructive Apneas -  Mixed Apneas -  Central Hypopneas -  Obstructive Hypopneas -  Central Apnea + Hypopnea (CAHI) 0.6  Obstructive Apnea + Hypopnea (OAHI) 7.3   Respiratory Event Durations   Apnea Hypopnea   NREM REM NREM REM  Average (seconds) 12.9 12.0 13.1 14.5  Maximum (seconds) 15.3 16.8 17.4 16.2    Oxygen Saturation Summary   Wake NREM REM TST TIB  Average SpO2 (%) 95.6% 94.8% 94.8% 94.8% 94.9%  Minimum SpO2 (%) 88.0% 88.0% 86.0% 86.0% 86.0%  Maximum SpO2 (%) 98.0% 98.0% 98.0% 98.0% 98.0%   Oxygen Saturation Distribution  Range (%) Time in range (min) Time in range (%)  90.0 - 100.0 382.1 98.7%  80.0 - 90.0 5.1 1.3%  70.0 - 80.0 - -  60.0 - 70.0 - -  50.0 - 60.0 - -  0.0 - 50.0 - -  Time Spent <=88% SpO2  Range (%)  Time in range (min) Time in range (%)  0.0 - 88.0 1.1 0.3%      Count Index  Desaturations 63 12.0    Cardiac Summary   Wake NREM REM Sleep Total  Average Pulse Rate (BPM) 71.8 70.4 69.6 70.3 70.5  Minimum Pulse Rate (BPM) 50.0 50.0 53.0 50.0 50.0  Maximum Pulse Rate (BPM) 94.0 100.0 90.0 100.0 100.0   Pulse Rate Distribution:  Range (  bpm) Time in range (min) Time in range (%)  0.0 - 40.0 - -  40.0 - 60.0 11.2 2.9%  60.0 - 80.0 367.9 94.7%  80.0 - 100.0 9.3 2.4%  100.0 - 120.0 - -  120.0 - 140.0 - -  140.0 - 200.0 - -   EtCO2 Summary  Stage Min (mmHg) Average (mmHg) Max (mmHg)  Wake - - -  NREM (1+2+3) - - -  REM - - -   EtCO2 Distribution:  Range (mmHg) Time in range (min) Time in range (%)  20.0 - 40.0 - -  40.0 - 50.0 - -  50.0 - 100.0 - -  55.0 - 100.0 - -  Excluded data <20.0 & >65.0 389.0 100.0%     Hypnograms                         Technologist Comments  Pt is here trying to find where hand and foot pain is coming from. He went to sleep easily after reading a short while. He looked great for a long time. He had no events and slept supine and lateral. He went into rem and began having events. I observed apneas, hypopneas and reras. He was primarily on back. By 2AM, he only had 20 events though.  He continued in Rem for a long period, having more events. He does have PLMS with and without arousals indicating some restless legs I think. He had some irregular breathing but they were not long enough to call. He also had some large arousals that did not connect to events. I did look on a slower page but I wasn't sure if it was just an arousal or possibly spikes. I did not take a picture because I wasn't sure. I did right the epochs down which were 310,380 and 389 as examples. I did not observe any unusual cardiac abnormalities during the study.  Pt did worsen as the night went on. Snores look on the study louder than they were but he did snore  most of the time. He will need a full-face mask but does have a heavy beard. I tried a [employer] which he did like very much. If he had met criteria, I would have had to get the leak down more but it would be a good mask for him.                                                                                                                        [employer] Diplomate, Biomedical engineer of Sleep Medicine  ELECTRONICALLY SIGNED ON:  04/18/2024, 12:00 PM [facility] PH: [phone]   FX: [phone] ACCREDITED BY THE AMERICAN ACADEMY OF SLEEP MEDICINE

## 2024-04-22 ENCOUNTER — Encounter (HOSPITAL_BASED_OUTPATIENT_CLINIC_OR_DEPARTMENT_OTHER): Admitting: Internal Medicine

## 2024-04-23 ENCOUNTER — Encounter: Payer: Self-pay | Admitting: Internal Medicine

## 2024-04-24 NOTE — Telephone Encounter (Signed)
**Note De-identified  [place] Obfuscation** Please advise 

## 2024-04-27 ENCOUNTER — Encounter: Payer: Self-pay | Admitting: Family Medicine

## 2024-04-28 NOTE — Telephone Encounter (Signed)
Please advise on sleep study.

## 2024-04-29 ENCOUNTER — Other Ambulatory Visit: Payer: Self-pay

## 2024-04-29 ENCOUNTER — Telehealth: Payer: Self-pay | Admitting: *Deleted

## 2024-04-29 DIAGNOSIS — G4733 Obstructive sleep apnea (adult) (pediatric): Secondary | ICD-10-CM

## 2024-04-29 NOTE — Telephone Encounter (Signed)
 Sleep study showed mild obstructive sleep apnea, averaging 7-8 apneas/ hour. I can't promise that treating this will help with the symptoms you having- they may be unrelated- but we can try. If yu agree, I will have my office set up a trial of CPAP for you.  Order- new DME, new CPAP auto 4-15, mask of choice, heated humidity, supplies, AirView/ card

## 2024-04-29 NOTE — Telephone Encounter (Signed)
 Spoke with patient regarding CY's note. Patient agrees to start CPAP therapy. Pt did not have any concerns and verbalized agreement. Placing CPAP order. NFN.

## 2024-04-29 NOTE — Telephone Encounter (Signed)
 Copied from CRM [id number]. Topic: General - Other >> Apr 28, 2024  4:57 PM [place] S wrote: Reason for CRM: patient is in excruciating pain and would like to know what is the next steps he needs to take in order to relieve his symptoms for the sleepy study. Patient is aware of appointment but states it is too far away and he want to get help with the pain. He would like to speak to someone in regards to speaking with dr young or scheduling a sooner appointment.  ATC patient x1.  Left detailed message per DPR.  Sleep study is scheduled for 06/02/2024.  Dr. Linder Revere, please advise on any recommendations you may have so assist patient in being comfortable during the sleep study.  Looks like he sees neurology and sports medicine.  Is this a better question for one of the?  Thank you.

## 2024-04-29 NOTE — Telephone Encounter (Signed)
 Handling in telephone encounter. NFN.

## 2024-05-06 NOTE — Telephone Encounter (Signed)
 Called and spoke with patient, I provided information per Dr. Linder Revere.  As I was providing the information, the patient stated that he had already had the sleep study done and that he f/u with Dr. Linder Revere is on 06/02/24 and that he was looking for results/recommendations based on the sleep study.  I apologized for the message being taken incorrectly and that I would provide that information to Dr. Linder Revere.  I did let him know that I had attempted to reach him by phone and had left him a message to return the call, however had not received a return call.  I let him know we would call him back after we hear back from Dr. Linder Revere.  He verbalized understanding.  Dr. Linder Revere, Patient is wanting results/recommendations from sleep study that has already been completed.  His follow up with you is not until 06/02/24.  The initial message was taken incorrectly.  He stated that he had not really gotten any feedback about the results of the sleep study and recommendations.  Please advise.

## 2024-05-06 NOTE — Progress Notes (Signed)
 [facility] [address] [place] [address] Phone: [phone] Subjective:   Devin Hoffman am a scribe for Dr. Felipe Horton.   I'm seeing this patient by the request  of:  Jobe Mulder, DO  CC: Elbow pain follow-up  [id number]  03/02/2024 Bilateral paresthesias intermittently of the hands and feet.  We have been working on supplement especially at the vitamin D  and significantly low initially.  Patient did bring in a video that did show that patient is having some tremors it appears while he was sleeping.  Will send for sleep study for further evaluation.  Depending on findings there this could change medical management.  Could be causing more muscle fatigue follow-up again in 6 to 8 weeks     Injection given today and tolerated the procedure well, discussed icing regimen and home exercises, discussed which activities to do and which ones to avoid such as overhead lifting.  Patient should make improvement and follow-up again in 6 to 8 weeks.     Updated 05/07/2024 Devin Hoffman is a 43 y.o. male coming in with complaint of elbow pain, was given an injection in the lateral epicondylar region in April. Patient states that has been pretty good. Very minimum pain. Had a sleep study done about a month ago. Pulmonologist reviewed the results but didn't really do much. Would like to know if there is something else that can be done.   Patient did have a sleep study that she did show obstructive sleep apnea.    Past Medical History:  Diagnosis Date   Recurrent depression (HCC)    Past Surgical History:  Procedure Laterality Date   HAND SURGERY Left    KNEE SURGERY     Social History   Socioeconomic History   Marital status: Married    Spouse name: Not on file   Number of children: Not on file   Years of education: Not on file   Highest education level: Associate degree: occupational, Scientist, product/process development, or vocational program  Occupational  History   Not on file  Tobacco Use   Smoking status: Former    Current packs/day: 0.00    Types: Cigarettes    Quit date: 2004    Years since quitting: 21.4    Passive exposure: Never   Smokeless tobacco: Never  Substance and Sexual Activity   Alcohol use: No   Drug use: No   Sexual activity: Yes    Partners: Female    Birth control/protection: None  Other Topics Concern   Not on file  Social History Narrative   Not on file   Social Drivers of Health   Financial Resource Strain: Low Risk  (03/07/2023)   Overall Financial Resource Strain (CARDIA)    Difficulty of Paying Living Expenses: Not hard at all  Food Insecurity: No Food Insecurity (03/07/2023)   Hunger Vital Sign    Worried About Running Out of Food in the Last Year: Never true    Ran Out of Food in the Last Year: Never true  Transportation Needs: No Transportation Needs (03/07/2023)   PRAPARE - Administrator, Civil Service (Medical): No    Lack of Transportation (Non-Medical): No  Physical Activity: Insufficiently Active (03/07/2023)   Exercise Vital Sign    Days of Exercise per Week: 3 days    Minutes of Exercise per Session: 30 min  Stress: Stress Concern Present (03/07/2023)   [employer] of Occupational Health - Occupational Stress Questionnaire  Feeling of Stress : To some extent  Social Connections: Moderately Isolated (03/07/2023)   Social Connection and Isolation Panel    Frequency of Communication with Friends and Family: More than three times a week    Frequency of Social Gatherings with Friends and Family: Once a week    Attends Religious Services: Never    Database administrator or Organizations: No    Attends Engineer, structural: Not on file    Marital Status: Married   Allergies  Allergen Reactions   Colchicine      Other Reaction(s): myopathy   Family History  Problem Relation Age of Onset   Alcohol abuse Father    Gout Father    Autism spectrum disorder Brother     Colon cancer Maternal Grandmother    Pancreatic cancer Paternal Grandfather      Current Outpatient Medications (Cardiovascular):    EPINEPHrine  (EPIPEN  2-PAK) 0.3 mg/0.3 mL IJ SOAJ injection, Inject 0.3 mg into the muscle as needed for anaphylaxis.   Current Outpatient Medications (Analgesics):    celecoxib  (CELEBREX ) 100 MG capsule, TAKE 1 CAPSULE BY MOUTH TWICE A DAY   Current Outpatient Medications (Other):    DULoxetine  (CYMBALTA ) 20 MG capsule, Take 1 capsule (20 mg total) by mouth daily.   pramipexole (MIRAPEX) 0.125 MG tablet, Take 1 tablet (0.125 mg total) by mouth 3 (three) times daily.   amphetamine -dextroamphetamine (ADDERALL XR) 20 MG 24 hr capsule, Take 1 capsule (20 mg total) by mouth every morning.   Vitamin D , Ergocalciferol , (DRISDOL ) 1.25 MG (50000 UNIT) CAPS capsule, TAKE 1 CAPSULE (50,000 UNITS TOTAL) BY MOUTH EVERY 7 (SEVEN) DAYS   Reviewed prior external information including notes and imaging from  primary care provider As well as notes that were available from care everywhere and other healthcare systems.  Past medical history, social, surgical and family history all reviewed in electronic medical record.  No pertanent information unless stated regarding to the chief complaint.   Review of Systems:  No headache, visual changes, nausea, vomiting, diarrhea, constipation, dizziness, abdominal pain, skin rash, fevers, chills, night sweats, weight loss, swollen lymph nodes, body aches, joint swelling, chest pain, shortness of breath, mood changes. POSITIVE muscle aches, muscle spasming at night and continued discomfort otherwise.  Objective  Blood pressure 138/80, pulse 89, height 6' (1.829 m), weight 236 lb (107 kg), SpO2 98%.   General: No apparent distress alert and oriented x3 mood and affect normal, dressed appropriately.  HEENT: Pupils equal, extraocular movements intact  Respiratory: Patient's speak in full sentences and does not appear short of breath   Cardiovascular: No lower extremity edema, non tender, no erythema  Patient's legs mild tightness of the hamstring but otherwise fairly unremarkable.  Sitting comfortably.    Impression and Recommendations:     The above documentation has been reviewed and is accurate and complete Devin Hoffman M Ellwyn Ergle, DO

## 2024-05-06 NOTE — Telephone Encounter (Signed)
 Pain management may need to be improved so he can sleep for sleep study, whenever that is scheduled for. He should contact his Neurologist or other managing provider for help with pain, since this is not caused by sleep issues for which I am seeing him.

## 2024-05-07 ENCOUNTER — Other Ambulatory Visit: Payer: Self-pay

## 2024-05-07 ENCOUNTER — Encounter: Payer: Self-pay | Admitting: Family Medicine

## 2024-05-07 ENCOUNTER — Ambulatory Visit: Admitting: Family Medicine

## 2024-05-07 VITALS — BP 138/80 | HR 89 | Ht 72.0 in | Wt 236.0 lb

## 2024-05-07 DIAGNOSIS — G2581 Restless legs syndrome: Secondary | ICD-10-CM

## 2024-05-07 DIAGNOSIS — M25521 Pain in right elbow: Secondary | ICD-10-CM

## 2024-05-07 MED ORDER — DULOXETINE HCL 20 MG PO CPEP: 20.0000 mg | ORAL_CAPSULE | Freq: Every day | ORAL | 3 refills | Status: DC

## 2024-05-07 MED ORDER — PRAMIPEXOLE DIHYDROCHLORIDE 0.125 MG PO TABS: 0.1250 mg | ORAL_TABLET | Freq: Three times a day (TID) | ORAL | 1 refills | Status: DC

## 2024-05-07 NOTE — Patient Instructions (Addendum)
 Good to see you. Start Cymbalta . Start Miraplex at night.  Check in in 2 weeks. Follow up in 2 months.

## 2024-05-07 NOTE — Assessment & Plan Note (Addendum)
 After discussing with patient we will try Mirapex.  Still think possibly treating the underlying obstructive sleep apnea with a mask could be helpful as well.  Patient has been working out on a more regular basis as well and is noticing some improvement.  Follow-up with me again in 6 to 8 weeks otherwise.  Refilled the Cymbalta  with seem to be working as well.  I will restart at 20 mg because patient did discontinue for some time.

## 2024-05-26 ENCOUNTER — Encounter: Payer: Self-pay | Admitting: Family Medicine

## 2024-05-29 ENCOUNTER — Other Ambulatory Visit: Payer: Self-pay | Admitting: Family Medicine

## 2024-05-30 NOTE — Progress Notes (Unsigned)
 [facility] [address] [place], [place] [address] Tel: [phone]   Fax: [phone]  Polysomnography Interpretation  Patient Name:  Devin, Hoffman Date:  04/10/2024 Referring Physician:  Neysa Rama  Indications for Polysomnography The patient is a 43 year old Male who is 6' and weighs 239.0 lbs. His BMI equals 32.7.  A full night polysomnogram was performed to evaluate for -OSA.  Medication none taken  No Data.   Polysomnogram Data A full night polysomnogram recorded the standard physiologic parameters including EEG, EOG, EMG, EKG, nasal and oral airflow.  Respiratory parameters of chest and abdominal movements were recorded with Respiratory Inductance Plethysmography belts.  Oxygen saturation was recorded by pulse oximetry.   Sleep Architecture The total recording time of the polysomnogram was 388.6 minutes.  The total sleep time was 314.0 minutes.  The patient spent 1.0% of total sleep time in Stage N1, 64.6% in Stage N2, 16.9% in Stages N3, and 17.5% in REM.  Sleep latency was 13.3 minutes.  REM latency was 96.0 minutes.  Sleep Efficiency was 80.8%.  Wake after Sleep Onset time was 60.5 minutes.  Respiratory Events The polysomnogram revealed a presence of - obstructive, 3 centrals, and - mixed apneas resulting in an Apnea index of 5.4 events per hour.  There were 11 hypopneas (>=3% desaturation and/or arousal) resulting in an Apnea\Hypopnea Index (AHI >=3% desaturation and/or arousal) of 7.5 events per hour.  There were 5 hypopneas (>=4% desaturation) resulting in an Apnea\Hypopnea Index (AHI >=4% desaturation) of 6.3 events per hour.  There were 1 Respiratory Effort Related Arousals resulting in a RERA index of 0.2 events per hour. The Respiratory Disturbance Index is 7.6 events per hour.  The snore index was - events per hour.  Mean oxygen saturation was 94.9%.  The lowest oxygen saturation during sleep was 86.0%.  Time  spent <=88% oxygen saturation was 1.1 minutes (0.3%).  End Tidal CO2 during sleep ranged from - to - mmHg. End Tidal CO2 was greater than 50 mmHg for - minutes and greater than 55 mmHg for - minutes.  Limb Activity There were 143 total limb movements recorded, of this total, 131 were classified as PLMs.  PLM index was 25.0 per hour and PLM associated with Arousals index was 0.6 per hour.  Cardiac Summary The average pulse rate was 70.5 bpm.  The minimum pulse rate was 50.0 bpm while the maximum pulse rate was 100.0 bpm.  Cardiac rhythm was normal.  Comment: Mild obstructive sleep apnea, AHI (3%) 7.5/hr. Snoring with oxygen desaturation to a nadir of 86%, mean 94%.  Diagnosis: Obstructive sleep apnea  Recommendations: Depending on clinical consideration of symptoms and co-morbidity, conservative management, CPAP or a fitted oral appliance may be appropriate.   This study was personally reviewed and electronically signed by: Neysa Rama  MD Accredited Board Certified in Sleep Medicine Date/Time: 04/18/24    12:04        Diagnostic PSG Report  Patient Name: Devin, Hoffman Date: 04/10/2024  Date of Birth: Mar 13, 1981 Study Type: Diagnostic  Age: 53 year MRN #: [id number]  Sex: Male Interpreting Physician: NEYSA RAMA [id number]  Height: 6' Referring Physician: Neysa Rama MD  Weight: 239.0 lbs Recording Tech: Jamee McConnico RPSGT RST  BMI: 32.7 Scoring Tech: Yvonne McConnico RPSGT RST  ESS: 6 Neck Size: 17.5   Study Overview  Lights Off: 10:17:27 PM  Count Index  Lights On: 04:46:06 AM Awakenings: 17 3.2  Time in Bed: 388.6 min.  Arousals: 11 2.1  Total Sleep Time: 314.0 min. AHI (>=3% Desat and/or Ar.): 39 7.5   Sleep Efficiency: 80.8% AHI (>=4% Desat): 33 6.3   Sleep Latency: 13.3 min. Limb Movements: 143 27.3  Wake After Sleep Onset: 60.5 min. Snore: - -  REM Latency from Sleep Onset: 96.0 min. Desaturations: 63 12.0     Minimum SpO2 TST: 86.0%     Sleep Architecture  % of Time in Bed Stages Time (mins) % Sleep Time  Wake 74.0   Stage N1 3.0 1.0%  Stage N2 203.0 64.6%  Stage N3 53.0 16.9%  REM 55.0 17.5%   Arousal Summary   NREM REM Sleep Index  Respiratory Arousals - 1 1 0.2  PLM Arousals 3 - 3 0.6  Isolated Limb Movement Arousals 3 - 3 0.6  Snore Arousals - - - -  Spontaneous Arousals 4 - 4 0.8  Total 10 1 11  2.1   Limb Movement Summary   Count Index  Isolated Limb Movements 12 2.3  Periodic Limb Movements (PLMs) 131 25.0  Total Limb Movements 143 27.3    Respiratory Summary   By Sleep Stage By Body Position Total   NREM REM Supine Non-Supine   Time (min) 259.0 55.0 166.5 147.5 314.0         Obstructive Apnea - - - - -  Mixed Apnea - - - - -  Central Apnea 3 - 1 2 3   Total Apneas 6 22 26 2 28   Total Apnea Index 1.4 24.0 9.4 0.8 5.4         Hypopneas (>=3% Desat and/or Ar.) 7 4 4 7 11   AHI (>=3% Desat and/or Ar.) 3.0 28.4 10.8 3.7 7.5         Hypopneas (>=4% Desat) 2 3 3 2 5   AHI (>=4% Desat) 1.9 27.3 10.5 1.6 6.3          RERAs 1 - - 1 1  RERA Index 0.2 - - 0.4 0.2         RDI 3.2 28.4 10.8 4.1 7.6    Respiratory Event Type Index  Central Apneas 0.6  Obstructive Apneas -  Mixed Apneas -  Central Hypopneas -  Obstructive Hypopneas -  Central Apnea + Hypopnea (CAHI) 0.6  Obstructive Apnea + Hypopnea (OAHI) 7.3   Respiratory Event Durations   Apnea Hypopnea   NREM REM NREM REM  Average (seconds) 12.9 12.0 13.1 14.5  Maximum (seconds) 15.3 16.8 17.4 16.2    Oxygen Saturation Summary   Wake NREM REM TST TIB  Average SpO2 (%) 95.6% 94.8% 94.8% 94.8% 94.9%  Minimum SpO2 (%) 88.0% 88.0% 86.0% 86.0% 86.0%  Maximum SpO2 (%) 98.0% 98.0% 98.0% 98.0% 98.0%   Oxygen Saturation Distribution  Range (%) Time in range (min) Time in range (%)  90.0 - 100.0 382.1 98.7%  80.0 - 90.0 5.1 1.3%  70.0 - 80.0 - -  60.0 - 70.0 - -  50.0 - 60.0 - -  0.0 - 50.0 - -  Time Spent <=88% SpO2  Range (%)  Time in range (min) Time in range (%)  0.0 - 88.0 1.1 0.3%      Count Index  Desaturations 63 12.0    Cardiac Summary   Wake NREM REM Sleep Total  Average Pulse Rate (BPM) 71.8 70.4 69.6 70.3 70.5  Minimum Pulse Rate (BPM) 50.0 50.0 53.0 50.0 50.0  Maximum Pulse Rate (BPM) 94.0 100.0 90.0 100.0 100.0   Pulse Rate Distribution:  Range (  bpm) Time in range (min) Time in range (%)  0.0 - 40.0 - -  40.0 - 60.0 11.2 2.9%  60.0 - 80.0 367.9 94.7%  80.0 - 100.0 9.3 2.4%  100.0 - 120.0 - -  120.0 - 140.0 - -  140.0 - 200.0 - -   EtCO2 Summary  Stage Min (mmHg) Average (mmHg) Max (mmHg)  Wake - - -  NREM (1+2+3) - - -  REM - - -   EtCO2 Distribution:  Range (mmHg) Time in range (min) Time in range (%)  20.0 - 40.0 - -  40.0 - 50.0 - -  50.0 - 100.0 - -  55.0 - 100.0 - -  Excluded data <20.0 & >65.0 389.0 100.0%     Hypnograms                         Technologist Comments  Pt is here trying to find where hand and foot pain is coming from. He went to sleep easily after reading a short while. He looked great for a long time. He had no events and slept supine and lateral. He went into rem and began having events. I observed apneas, hypopneas and reras. He was primarily on back. By 2AM, he only had 20 events though.  He continued in Rem for a long period, having more events. He does have PLMS with and without arousals indicating some restless legs I think. He had some irregular breathing but they were not long enough to call. He also had some large arousals that did not connect to events. I did look on a slower page but I wasn't sure if it was just an arousal or possibly spikes. I did not take a picture because I wasn't sure. I did right the epochs down which were 310,380 and 389 as examples. I did not observe any unusual cardiac abnormalities during the study.  Pt did worsen as the night went on. Snores look on the study louder than they were but he did snore  most of the time. He will need a full-face mask but does have a heavy beard. I tried a [employer] which he did like very much. If he had met criteria, I would have had to get the leak down more but it would be a good mask for him.                                                                                                                        Reggy Salt Diplomate, Biomedical engineer of Sleep Medicine  ELECTRONICALLY SIGNED ON:  05/30/2024, 8:54 PM [facility] PH: [phone]   FX: [phone] ACCREDITED BY THE AMERICAN ACADEMY OF SLEEP MEDICINE

## 2024-05-30 NOTE — Progress Notes (Unsigned)
 03/03/24- Devin Hoffman for sleep evaluation with concern of OSA and RLS. Medical problem list includes  Neuropathy, Lumbar Radiculopathy, Depression, ADHD, -Venlafexine, Cymbalta , Adderall XR 20. Epworth score-7 Body weight today 239 lbs -----Pain in hands and feet.  A lot of movement in arms and legs. Question if RLS or seizures per Dr. Arthea Bale No sleep med, 3 cups AM coffee. No ENT surgery. No heart or lung disease. Says he twitches' in arms and legs esp when trying to sleep. Iron levels tested ok. Discussed the use of AI scribe software for clinical note transcription with the patient, who gave verbal consent to proceed.  History of Present Illness   The patient, with a history of restless sleep and daytime fatigue, presents after a year and a half of trying various treatments. He reports that his symptoms began with foot pain, which then progressed to hand pain. He has undergone neurological tests and has been taken off various medications, including cholesterol medication, Prozac , and Adderall, with no change in symptoms. He has also undergone a nerve conductivity test, which showed no abnormalities. Recently, his wife recorded him at night and noticed that he was restless and twitching. He has also started snoring, which is a new symptom. He reports no association of limb movements with dreams and is able to fall asleep easily He does not describe restless crawling sensation in legs preventing sleep onset. SABRA However, he does report a need to move his legs if he feels restricted, such as by a heavy blanket .He does not describe complex parasomnias such as punching, kicking or attacking bed partner. He denies any family history of sleep problems, dementia, Alzheimer's, Lewy body dementia, or Parkinson's disease, but does report a sister with schizophrenia. He consumes three cups of coffee in the morning and denies any surgery in the nose or throat, or any known heart or lung conditions. He reports  feeling exhausted by 5pm and has stopped exercising in the past six months due to this fatigue. He denies any significant anemia as an adult.     Assessment and Plan:    Sleep disturbances Experiences twitching, kicking, snoring, leading to fatigue. Differential includes benign hypnic jerks,  REM behavior disorder, restless leg syndrome, nocturnal seizures and partial arousals related to apnea events.. Previous neurological evaluations unremarkable. Recommended sleep study for further evaluation. - Order overnight sleep study at sleep disorder center to evaluate for sleep disturbances, including nocturnal seizures and sleep apnea. - Ensure sleep study includes EEG leads for brain wave and muscle movement monitoring. - Advise avoidance of napping on study day and maintenance of usual medication routines. - Instruct preparation for overnight stay at sleep disorder center. - Explained sleep study involves no painful procedures and concludes by early morning.   06/02/24- Devin Hoffman  Devin Hoffman for sleep evaluation with concern of OSA and RLS. Medical problem list includes  Neuropathy, Lumbar Radiculopathy, Depression, ADHD, -----Pain in hands and feet.  A lot of movement in arms and legs. Question if RLS or seizures per Dr. Arthea Bale -Venlafexine, Cymbalta , Adderall XR 20. Body weight today  HST 04/10/24- AHI 7.5/hr, body weight 239 lbs>  Tech comment: Pt is here trying to find where hand and foot pain is coming from. He went to sleep easily after reading a short while. He looked great for a long time. He had no events and slept supine and lateral. He went into rem and began having events. I observed apneas, hypopneas and reras. He was primarily on  back. By 2AM, he only had 20 events though.  He continued in Rem for a long period, having more events. He does have PLMS with and without arousals indicating some restless legs I think. He had some irregular breathing but they were not long enough to call. He  also had some large arousals that did not connect to events. I did look on a slower page but I wasn't sure if it was just an arousal or possibly spikes. I did not take a picture because I wasn't sure. I did right the epochs down which were 310,380 and 389 as examples. I did not observe any unusual cardiac abnormalities during the study.  Pt did worsen as the night went on. Snores look on the study louder than they were but he did snore most of the time. He will need a full-face mask but does have a heavy beard. I tried a [employer] which he did like very much. If he had met criteria, I would have had to get the leak down more but it would be a good mask for him.       ROS-see HPI   + = positive Constitutional:    weight loss, night sweats, fevers, chills, fatigue, lassitude. HEENT:    headaches, difficulty swallowing, tooth/dental problems, sore throat,       sneezing, itching, ear ache, nasal congestion, post nasal drip, snoring CV:    chest pain, orthopnea, PND, swelling in lower extremities, anasarca,                                    ith exertion or at rest.                productive cough,   non-productive cough, coughing up of blood.              change in color of mucus.  wheezing.   Skin:    rash or lesions. GI:  No-   heartburn, indigestion, abdominal pain, nausea, vomiting, diarrhea,                 change in bowel habits, loss of appetite GU: dysuria, change in color of urine, no urgency or frequency.   flank pain. MS:   joint pain,+ stiffness, decreased range of motion, back pain. Neuro-     nothing unusual Psych:  change in mood or affect.  depression or +anxiety.   memory loss.  OBJ- Physical Exam General- Alert, Oriented, Affect-appropriate, Distress- none acute Skin- rash-none, lesions- none, excoriation- none Lymphadenopathy- none Head- atraumatic            Eyes- Gross vision intact, PERRLA, conjunctivae and secretions clear            Ears- Hearing,  canals-normal            Nose- Clear, no-Septal dev, mucus, polyps, erosion, perforation             Throat- Mallampati II , mucosa clear , drainage- none, tonsils- atrophic, +teeth Neck- flexible , trachea midline, no stridor , thyroid  nl, carotid no bruit Chest - symmetrical excursion , unlabored           Heart/CV- RRR , no murmur , no gallop  , no rub, nl s1 s2                           -  JVD- none , edema- none, stasis changes- none, varices- none           Lung- clear to P&A, wheeze- none, cough- none , dullness-none, rub- none           Chest wall-  Abd-  Br/ Gen/ Rectal- Not done, not indicated Extrem- cyanosis- none, clubbing, none, atrophy- none, strength- nl Neuro- grossly intact to observation

## 2024-06-02 ENCOUNTER — Encounter: Payer: Self-pay | Admitting: Internal Medicine

## 2024-06-02 ENCOUNTER — Ambulatory Visit (INDEPENDENT_AMBULATORY_CARE_PROVIDER_SITE_OTHER): Admitting: Internal Medicine

## 2024-06-02 ENCOUNTER — Ambulatory Visit: Admitting: Family Medicine

## 2024-06-02 VITALS — BP 122/76 | HR 93 | Temp 98.4°F | Ht 72.0 in | Wt 236.8 lb

## 2024-06-02 DIAGNOSIS — G4733 Obstructive sleep apnea (adult) (pediatric): Secondary | ICD-10-CM | POA: Diagnosis not present

## 2024-06-02 NOTE — Patient Instructions (Signed)
 Meds like Mirapex  or pregabalin might help the foot pain. They are routinely used for Restless Leg sleep disturbance. Worth a try.  Treatments for obstructive sleep apnea can be tried. I have no particular reason for why they would help your pain.  Pleas call if we can help

## 2024-06-11 ENCOUNTER — Ambulatory Visit
Admission: EM | Admit: 2024-06-11 | Discharge: 2024-06-11 | Disposition: A | Discharge status: Home / Self Care | Attending: Physician Assistant | Admitting: Physician Assistant

## 2024-06-11 ENCOUNTER — Other Ambulatory Visit: Payer: Self-pay

## 2024-06-11 DIAGNOSIS — H9202 Otalgia, left ear: Secondary | ICD-10-CM

## 2024-06-11 MED ORDER — HYDROCORTISONE-ACETIC ACID 1-2 % OT SOLN: 4.0000 [drp] | Freq: Two times a day (BID) | OTIC | 0 refills | Status: DC

## 2024-06-11 NOTE — ED Triage Notes (Signed)
 Pt presents with a chief complaint of left ear pain x 2 days. Currently rates pain a 7/10. OTC Excedrin taken with no pain relief. Also reporting severe headaches.

## 2024-06-11 NOTE — Discharge Instructions (Signed)
 You were seen today for concerns of left ear pain.  Your physical exam is consistent with an infection in your ear canal also known as otitis externa. I have sent in a medication called acetic acid -hydrocortisone  ear drops for you to instill in your left ear twice per day for 7 days  Please try to avoid getting the ear canal read or submerging your head in water as this can cause further infection and contamination in the area If you feel like your symptoms are not improving or seem to be worsening you can return here or follow-up with your PCP for further evaluation If you develop any of the following please return to urgent care or the emergency room as these could be signs of worsening infection: Severe pain of the ear, pain in the surrounding areas such as behind the ear or the jaw, fevers, displacement of the ear due to swelling, severe headaches, copious amounts of drainage and blood from the ear

## 2024-06-11 NOTE — ED Provider Notes (Signed)
 [facility]    CSN: [id number] Arrival date & time: 06/11/24  0802      History   Chief Complaint Chief Complaint  Patient presents with   Otalgia    HPI Devin Hoffman is a 43 y.o. male.   HPI  Pt is here for concerns of left ear pain and hearing changes that has been ongoing for several days He reports that his hearing is echoing and distorted He reports subjective fever, tenderness along the neck just below the left ear and headaches He denies drainage or bleeding from the ear   Interventions: Excedrin to help with headache    Past Medical History:  Diagnosis Date   Recurrent depression [facility])     Patient Active Problem List   Diagnosis Date Noted   Restless leg syndrome 05/07/2024   Right lateral epicondylitis 03/03/2024   Paresthesia 09/30/2023   Neuropathy 09/11/2023   Bilateral hand pain 06/12/2023   Statin intolerance 01/22/2023   Hyperlipidemia 03/23/2022   Attention deficit hyperactivity disorder (ADHD), 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 Authorizing Provider  acetic acid -hydrocortisone  (VOSOL -HC) OTIC solution Place 4 drops into the left ear 2 (two) times daily. 06/11/24  Yes Tremell Reimers E, PA-C  amphetamine -dextroamphetamine (ADDERALL XR) 20 MG 24 hr capsule Take 1 capsule (20 mg total) by mouth every morning. 11/19/23   Frann Mabel Mt, DO  DULoxetine  (CYMBALTA ) 20 MG capsule Take 1 capsule (20 mg total) by mouth daily. 05/07/24    Smith, Zachary M, DO  EPINEPHrine  (EPIPEN  2-PAK) 0.3 mg/0.3 mL IJ SOAJ injection Inject 0.3 mg into the muscle as needed for anaphylaxis. 03/07/23   Almarie Waddell NOVAK, NP  pramipexole  (MIRAPEX ) 0.125 MG tablet TAKE 1 TABLET (0.125 MG TOTAL) BY MOUTH 3 (THREE) TIMES DAILY. 06/01/24   Claudene Arthea HERO, DO  Vitamin D , Ergocalciferol , (DRISDOL ) 1.25 MG (50000 UNIT) CAPS capsule TAKE 1 CAPSULE (50,000 UNITS TOTAL) BY MOUTH EVERY 7 (SEVEN) DAYS 01/06/24   Claudene Arthea HERO, DO    Family History Family History  Problem Relation Age of Onset   Alcohol abuse Father    Gout Father    Autism spectrum disorder Brother    Colon cancer Maternal Grandmother    Pancreatic cancer Paternal Grandfather     Social History Social History   Tobacco Use   Smoking status: Former    Current packs/day: 0.00    Types: Cigarettes    Quit date: 2004    Years since quitting: 21.5    Passive exposure: Never   Smokeless tobacco: Never  Vaping Use   Vaping status: Never Used  Substance Use Topics   Alcohol use: No   Drug use: No     Allergies   Colchicine    Review of Systems Review of Systems  Constitutional:  Positive for chills and fever (unsure- has not checked temp but felt feverish last night).  HENT:  Positive for ear pain and hearing loss (hearing is echoing). Negative for congestion,  ear discharge, rhinorrhea, sinus pressure, sinus pain and sore throat.   Neurological:  Positive for headaches.     Physical Exam Triage Vital Signs ED Triage Vitals  Encounter Vitals Group     BP 06/11/24 0821 114/79     Girls Systolic BP Percentile --      Girls Diastolic BP Percentile --      Boys Systolic BP Percentile --      Boys Diastolic BP Percentile --      Pulse Rate 06/11/24 0821 77     Resp 06/11/24 0821 18     Temp 06/11/24 0821 98.5 F (36.9 C)     Temp Source 06/11/24 0821 Oral     SpO2 06/11/24 0821 96 %     Weight 06/11/24 0824 227 lb (103 kg)     Height 06/11/24 0824 6' (1.829 m)      Head Circumference --      Peak Flow --      Pain Score 06/11/24 0823 7     Pain Loc --      Pain Education --      Exclude from Growth Chart --    No data found.  Updated Vital Signs BP 114/79 (BP Location: Right Arm)   Pulse 77   Temp 98.5 F (36.9 C) (Oral)   Resp 18   Ht 6' (1.829 m)   Wt 227 lb (103 kg)   SpO2 96%   BMI 30.79 kg/m   Visual Acuity Right Eye Distance:   Left Eye Distance:   Bilateral Distance:    Right Eye Near:   Left Eye Near:    Bilateral Near:     Physical Exam Vitals reviewed.  Constitutional:      General: He is awake. He is not in acute distress.    Appearance: Normal appearance. He is well-developed and well-groomed. He is not ill-appearing or toxic-appearing.  HENT:     Head: Normocephalic and atraumatic.     Right Ear: Hearing, tympanic membrane and ear canal normal.     Left Ear: Hearing, tympanic membrane and ear canal normal.     Mouth/Throat:     Lips: Pink.     Mouth: Mucous membranes are moist.     Pharynx: Oropharynx is clear. Uvula midline. No pharyngeal swelling, oropharyngeal exudate, posterior oropharyngeal erythema, uvula swelling or postnasal drip.     Tonsils: No tonsillar exudate.  Eyes:     Extraocular Movements: Extraocular movements intact.     Conjunctiva/sclera: Conjunctivae normal.  Pulmonary:     Effort: Pulmonary effort is normal.  Musculoskeletal:     Cervical back: Normal range of motion.  Lymphadenopathy:     Head:     Right side of head: No submental, submandibular, preauricular or posterior auricular adenopathy.     Left side of head: No submental, submandibular, preauricular or posterior auricular adenopathy.     Cervical:     Right cervical: No superficial cervical adenopathy.    Left cervical: No superficial cervical adenopathy.  Neurological:     Mental Status: He is alert and oriented to person, place, and time.  Psychiatric:        Attention and Perception: Attention normal.        Mood and  Affect: Mood normal.        Speech: Speech normal.        Behavior: Behavior normal. Behavior is cooperative.      UC Treatments / Results  Labs (all labs ordered  are listed, but only abnormal results are displayed) Labs Reviewed - No data to display  EKG   Radiology No results found.  Procedures Procedures (including critical care time)  Medications Ordered in UC Medications - No data to display  Initial Impression / Assessment and Plan / UC Course  I have reviewed the triage vital signs and the nursing notes.  Pertinent labs & imaging results that were available during my care of the patient were reviewed by me and considered in my medical decision making (see chart for details).      Final Clinical Impressions(s) / UC Diagnoses   Final diagnoses:  Ear pain, left   Patient presents today with concerns for left ear pain has been ongoing for several days.  He denies nasal congestion, sore throat, neck pain or swelling but does report some discomfort along the neck leading down from the left ear.  Physical exam is negative for erythema, bulging tympanic membranes bilaterally.  Bilateral ear canals are normal without signs of drainage, swelling but there is tenderness to the left ear canal.  Given symptoms we will start acetic acid -hydrocortisone  otic drops to assist with discomfort and treat potential otitis externa.  Recommend Tylenol  and ibuprofen  as needed for pain management.  ED and return precautions reviewed and provided in after visit summary.  Follow-up as needed for progressing or persistent symptoms    Discharge Instructions      You were seen today for concerns of left ear pain.  Your physical exam is consistent with an infection in your ear canal also known as otitis externa. I have sent in a medication called acetic acid -hydrocortisone  ear drops for you to instill in your left ear twice per day for 7 days  Please try to avoid getting the ear canal read or  submerging your head in water as this can cause further infection and contamination in the area If you feel like your symptoms are not improving or seem to be worsening you can return here or follow-up with your PCP for further evaluation If you develop any of the following please return to urgent care or the emergency room as these could be signs of worsening infection: Severe pain of the ear, pain in the surrounding areas such as behind the ear or the jaw, fevers, displacement of the ear due to swelling, severe headaches, copious amounts of drainage and blood from the ear      ED Prescriptions     Medication Sig Dispense Auth. Provider   acetic acid -hydrocortisone  (VOSOL -HC) OTIC solution Place 4 drops into the left ear 2 (two) times daily. 10 mL Solveig Fangman E, PA-C      PDMP not reviewed this encounter.   Marylene Rocky BRAVO, PA-C 06/11/24 1047

## 2024-07-13 ENCOUNTER — Ambulatory Visit: Admitting: Family Medicine

## 2024-07-13 ENCOUNTER — Encounter: Payer: Self-pay | Admitting: Family Medicine

## 2024-07-13 ENCOUNTER — Ambulatory Visit: Payer: Self-pay | Admitting: Family Medicine

## 2024-07-13 VITALS — BP 110/78 | HR 83 | Temp 98.0°F | Resp 16 | Ht 72.0 in | Wt 237.0 lb

## 2024-07-13 DIAGNOSIS — F900 Attention-deficit hyperactivity disorder, predominantly inattentive type: Secondary | ICD-10-CM

## 2024-07-13 DIAGNOSIS — E162 Hypoglycemia, unspecified: Secondary | ICD-10-CM | POA: Diagnosis not present

## 2024-07-13 LAB — HEMOGLOBIN A1C: Hgb A1c MFr Bld: 5.9 % (ref 4.6–6.5)

## 2024-07-13 MED ORDER — LISDEXAMFETAMINE DIMESYLATE 30 MG PO CAPS: 30.0000 mg | ORAL_CAPSULE | Freq: Every day | ORAL | 0 refills | Status: DC

## 2024-07-13 NOTE — Patient Instructions (Signed)
Keep the diet clean and stay active.  Give us 2-3 business days to get the results of your labs back.   Let us know if you need anything.  

## 2024-07-13 NOTE — Addendum Note (Signed)
 Addended by: FRANN MABEL SQUIBB on: 07/13/2024 12:45 PM   Modules accepted: Orders, Level of Service

## 2024-07-13 NOTE — Progress Notes (Addendum)
 Chief Complaint  Patient presents with   Blood Sugar Problem    Blood Sugar     Subjective: Patient is a 43 y.o. male here for low sugar.  Hx of low sugar happening 1-2 times per year. Over the past 6 mo, has had it 1-2 times per mo. He will feel shaky, HA, and a little nausea. If he consumes something with simple sugar, he feels nearly immediately better. Usually associated with longer intervals from eating.   ADHD- taking Adderall. Causing insomnia. Took his wife's Vyvanse  and it worked better. Wondering if he can change.   Past Medical History:  Diagnosis Date   Recurrent depression (HCC)     Objective: BP 110/78 (BP Location: Left Arm, Patient Position: Sitting)   Pulse 83   Temp 98 F (36.7 C) (Oral)   Resp 16   Ht 6' (1.829 m)   Wt 237 lb (107.5 kg)   SpO2 99%   BMI 32.14 kg/m  General: Awake, appears stated age Lungs: No accessory muscle use Psych: Age appropriate judgment and insight, normal affect and mood  Assessment and Plan: Hypoglycemia - Plan: Insulin  and C-Peptide, Proinsulin, Hemoglobin A1c  Ck the above labs. Counseled on diet/exercise. Consider metformin. Consider more routine meals. F/u pending above.  Adverse effect of chronic med. Change Adderall to Vyvanse  30 mg/d.  The patient voiced understanding and agreement to the plan.  Mabel Mt [place], DO 07/13/24  7:54 AM

## 2024-07-14 LAB — INSULIN AND C-PEPTIDE, SERUM
C-Peptide: 6.6 ng/mL — ABNORMAL HIGH (ref 1.1–4.4)
INSULIN: 76.2 u[IU]/mL — ABNORMAL HIGH (ref 2.6–24.9)

## 2024-07-14 NOTE — Progress Notes (Deleted)
 [facility] [address] [place] [address] Phone: [phone] Subjective:    I'm seeing this patient by the request  of:  Devin Mabel Mt, DO  CC:   YEP:Dlagzrupcz  05/07/2024 After discussing with patient we will try Mirapex . Still think possibly treating the underlying obstructive sleep apnea with a mask could be helpful as well. Patient has been working out on a more regular basis as well and is noticing some improvement. Follow-up with me again in 6 to 8 weeks otherwise. Refilled the Cymbalta  with seem to be working as well. I will restart at 20 mg because patient did discontinue for some time.   Update 07/15/2024 Devin Hoffman is a 43 y.o. male coming in with complaint of R elbow and restless leg syndrome. Patient states        Past Medical History:  Diagnosis Date   Recurrent depression (HCC)    Past Surgical History:  Procedure Laterality Date   HAND SURGERY Left    KNEE SURGERY     Social History   Socioeconomic History   Marital status: Married    Spouse name: Not on file   Number of children: Not on file   Years of education: Not on file   Highest education level: Associate degree: academic program  Occupational History   Not on file  Tobacco Use   Smoking status: Former    Current packs/day: 0.00    Types: Cigarettes    Quit date: 2004    Years since quitting: 21.6    Passive exposure: Never   Smokeless tobacco: Never  Vaping Use   Vaping status: Never Used  Substance and Sexual Activity   Alcohol use: No   Drug use: No   Sexual activity: Yes    Partners: Female    Birth control/protection: None  Other Topics Concern   Not on file  Social History Narrative   Not on file   Social Drivers of Health   Financial Resource Strain: Low Risk  (07/08/2024)   Overall Financial Resource Strain (CARDIA)    Difficulty of Paying Living Expenses: Not hard at all  Food Insecurity: No Food Insecurity  (07/08/2024)   Hunger Vital Sign    Worried About Running Out of Food in the Last Year: Never true    Ran Out of Food in the Last Year: Never true  Transportation Needs: No Transportation Needs (07/08/2024)   PRAPARE - Administrator, Civil Service (Medical): No    Lack of Transportation (Non-Medical): No  Physical Activity: Insufficiently Active (07/08/2024)   Exercise Vital Sign    Days of Exercise per Week: 3 days    Minutes of Exercise per Session: 30 min  Stress: Stress Concern Present (07/08/2024)   [employer] of Occupational Health - Occupational Stress Questionnaire    Feeling of Stress: To some extent  Social Connections: Moderately Isolated (07/08/2024)   Social Connection and Isolation Panel    Frequency of Communication with Friends and Family: More than three times a week    Frequency of Social Gatherings with Friends and Family: Once a week    Attends Religious Services: Patient declined    Database administrator or Organizations: No    Attends Engineer, structural: Not on file    Marital Status: Married   Allergies  Allergen Reactions   Colchicine      Other Reaction(s): myopathy   Family History  Problem Relation Age of Onset  Alcohol abuse Father    Gout Father    Autism spectrum disorder Brother    Colon cancer Maternal Grandmother    Pancreatic cancer Paternal Grandfather      Current Outpatient Medications (Cardiovascular):    EPINEPHrine  (EPIPEN  2-PAK) 0.3 mg/0.3 mL IJ SOAJ injection, Inject 0.3 mg into the muscle as needed for anaphylaxis.     Current Outpatient Medications (Other):    acetic acid -hydrocortisone  (VOSOL -HC) OTIC solution, Place 4 drops into the left ear 2 (two) times daily.   DULoxetine  (CYMBALTA ) 20 MG capsule, Take 1 capsule (20 mg total) by mouth daily.   lisdexamfetamine (VYVANSE ) 30 MG capsule, Take 1 capsule (30 mg total) by mouth daily.   pramipexole  (MIRAPEX ) 0.125 MG tablet, TAKE 1 TABLET (0.125 MG  TOTAL) BY MOUTH 3 (THREE) TIMES DAILY.   Vitamin D , Ergocalciferol , (DRISDOL ) 1.25 MG (50000 UNIT) CAPS capsule, TAKE 1 CAPSULE (50,000 UNITS TOTAL) BY MOUTH EVERY 7 (SEVEN) DAYS   Reviewed prior external information including notes and imaging from  primary care provider As well as notes that were available from care everywhere and other healthcare systems.  Past medical history, social, surgical and family history all reviewed in electronic medical record.  No pertanent information unless stated regarding to the chief complaint.   Review of Systems:  No headache, visual changes, nausea, vomiting, diarrhea, constipation, dizziness, abdominal pain, skin rash, fevers, chills, night sweats, weight loss, swollen lymph nodes, body aches, joint swelling, chest pain, shortness of breath, mood changes. POSITIVE muscle aches  Objective  There were no vitals taken for this visit.   General: No apparent distress alert and oriented x3 mood and affect normal, dressed appropriately.  HEENT: Pupils equal, extraocular movements intact  Respiratory: Patient's speak in full sentences and does not appear short of breath  Cardiovascular: No lower extremity edema, non tender, no erythema      Impression and Recommendations:

## 2024-07-15 ENCOUNTER — Ambulatory Visit: Admitting: Family Medicine

## 2024-07-20 ENCOUNTER — Encounter: Payer: Self-pay | Admitting: Family Medicine

## 2024-07-20 ENCOUNTER — Ambulatory Visit (INDEPENDENT_AMBULATORY_CARE_PROVIDER_SITE_OTHER): Admitting: Family Medicine

## 2024-07-20 VITALS — BP 120/88 | HR 102 | Ht 72.0 in | Wt 234.0 lb

## 2024-07-20 DIAGNOSIS — M9903 Segmental and somatic dysfunction of lumbar region: Secondary | ICD-10-CM | POA: Diagnosis not present

## 2024-07-20 DIAGNOSIS — M5416 Radiculopathy, lumbar region: Secondary | ICD-10-CM

## 2024-07-20 DIAGNOSIS — M9904 Segmental and somatic dysfunction of sacral region: Secondary | ICD-10-CM | POA: Diagnosis not present

## 2024-07-20 DIAGNOSIS — M9902 Segmental and somatic dysfunction of thoracic region: Secondary | ICD-10-CM | POA: Diagnosis not present

## 2024-07-20 MED ORDER — LEVOTHYROXINE SODIUM 50 MCG PO TABS: 50.0000 ug | ORAL_TABLET | Freq: Every day | ORAL | 0 refills | Status: DC

## 2024-07-20 NOTE — Assessment & Plan Note (Signed)
 Chronic problem with exacerbation.  I discussed different treatment options.  Discussed a low-dose Synthroid  to potentially help with some of the polyarthralgia.  Will see if this makes any significant difference.  Patient has had a TSH in the 3.5 region previously.  Discussed icing regimen and home exercises.  Increase activity slowly.  Follow-up again in 6 to 8 weeks

## 2024-07-20 NOTE — Patient Instructions (Signed)
 Synthroid  called in See me in 2-3 months

## 2024-07-20 NOTE — Progress Notes (Signed)
 [name] [address] [place] [address] Phone: [phone] Subjective:   Devin Hoffman, am serving as a scribe for Dr. Arthea Claudene.  I'm seeing this patient by the request  of:  Frann Mabel Mt, DO  CC: Bilateral hand and foot pain  YEP:Dlagzrupcz  Devin Hoffman is a 43 y.o. male coming in with complaint of B hand and B foot pain. Pain is intermittent and unpredictable.   Mirapex  may or may not be helping due to good and bad days.       Past Medical History:  Diagnosis Date   Recurrent depression (HCC)    Past Surgical History:  Procedure Laterality Date   HAND SURGERY Left    KNEE SURGERY     Social History   Socioeconomic History   Marital status: Married    Spouse name: Not on file   Number of children: Not on file   Years of education: Not on file   Highest education level: Associate degree: academic program  Occupational History   Not on file  Tobacco Use   Smoking status: Former    Current packs/day: 0.00    Types: Cigarettes    Quit date: 2004    Years since quitting: 21.6    Passive exposure: Never   Smokeless tobacco: Never  Vaping Use   Vaping status: Never Used  Substance and Sexual Activity   Alcohol use: No   Drug use: No   Sexual activity: Yes    Partners: Female    Birth control/protection: None  Other Topics Concern   Not on file  Social History Narrative   Not on file   Social Drivers of Health   Financial Resource Strain: Low Risk  (07/08/2024)   Overall Financial Resource Strain (CARDIA)    Difficulty of Paying Living Expenses: Not hard at all  Food Insecurity: No Food Insecurity (07/08/2024)   Hunger Vital Sign    Worried About Running Out of Food in the Last Year: Never true    Ran Out of Food in the Last Year: Never true  Transportation Needs: No Transportation Needs (07/08/2024)   PRAPARE - Administrator, Civil Service (Medical): No    Lack of Transportation  (Non-Medical): No  Physical Activity: Insufficiently Active (07/08/2024)   Exercise Vital Sign    Days of Exercise per Week: 3 days    Minutes of Exercise per Session: 30 min  Stress: Stress Concern Present (07/08/2024)   [employer] of Occupational Health - Occupational Stress Questionnaire    Feeling of Stress: To some extent  Social Connections: Moderately Isolated (07/08/2024)   Social Connection and Isolation Panel    Frequency of Communication with Friends and Family: More than three times a week    Frequency of Social Gatherings with Friends and Family: Once a week    Attends Religious Services: Patient declined    Database administrator or Organizations: No    Attends Engineer, structural: Not on file    Marital Status: Married   Allergies  Allergen Reactions   Colchicine      Other Reaction(s): myopathy   Family History  Problem Relation Age of Onset   Alcohol abuse Father    Gout Father    Autism spectrum disorder Brother    Colon cancer Maternal Grandmother    Pancreatic cancer Paternal Grandfather     Current Outpatient Medications (Endocrine & Metabolic):    levothyroxine  (SYNTHROID ) 50 MCG tablet,  Take 1 tablet (50 mcg total) by mouth daily before breakfast.  Current Outpatient Medications (Cardiovascular):    EPINEPHrine  (EPIPEN  2-PAK) 0.3 mg/0.3 mL IJ SOAJ injection, Inject 0.3 mg into the muscle as needed for anaphylaxis.     Current Outpatient Medications (Other):    DULoxetine  (CYMBALTA ) 20 MG capsule, Take 1 capsule (20 mg total) by mouth daily.   lisdexamfetamine (VYVANSE ) 30 MG capsule, Take 1 capsule (30 mg total) by mouth daily.   pramipexole  (MIRAPEX ) 0.125 MG tablet, TAKE 1 TABLET (0.125 MG TOTAL) BY MOUTH 3 (THREE) TIMES DAILY.   Vitamin D , Ergocalciferol , (DRISDOL ) 1.25 MG (50000 UNIT) CAPS capsule, TAKE 1 CAPSULE (50,000 UNITS TOTAL) BY MOUTH EVERY 7 (SEVEN) DAYS   acetic acid -hydrocortisone  (VOSOL -HC) OTIC solution, Place 4 drops  into the left ear 2 (two) times daily.   Reviewed prior external information including notes and imaging from  primary care provider As well as notes that were available from care everywhere and other healthcare systems.  Past medical history, social, surgical and family history all reviewed in electronic medical record.  No pertanent information unless stated regarding to the chief complaint.   Review of Systems:  No headache, visual changes, nausea, vomiting, diarrhea, constipation, dizziness, abdominal pain, skin rash, fevers, chills, night sweats, weight loss, swollen lymph nodes, body aches, joint swelling, chest pain, shortness of breath, mood changes. POSITIVE muscle aches  Objective  Blood pressure 120/88, pulse (!) 102, height 6' (1.829 m), weight 234 lb (106.1 kg), SpO2 97%.   General: No apparent distress alert and oriented x3 mood and affect normal, dressed appropriately.  HEENT: Pupils equal, extraocular movements intact  Respiratory: Patient's speak in full sentences and does not appear short of breath  Cardiovascular: No lower extremity edema, non tender, no erythema  Continues to have bilateral hand and foot pain.  Back exam does have some loss of lordosis noted.  Tightness noted over the cyst left sacroiliac joint.  Negative straight leg test noted.  Osteopathic findings  T5 extended rotated and side bent right inhaled third rib T7 extended rotated and side bent left L1 flexed rotated and side bent right Sacrum left on left     Impression and Recommendations:  Lumbar radiculopathy Chronic problem with exacerbation.  I discussed different treatment options.  Discussed a low-dose Synthroid  to potentially help with some of the polyarthralgia.  Will see if this makes any significant difference.  Patient has had a TSH in the 3.5 region previously.  Discussed icing regimen and home exercises.  Increase activity slowly.  Follow-up again in 6 to 8 weeks    Decision today  to treat with OMT was based on Physical Exam  After verbal consent patient was treated with HVLA, ME, FPR techniques in  thoracic, rib, lumbar and sacral areas, all areas are chronic   Patient tolerated the procedure well with improvement in symptoms  Patient given exercises, stretches and lifestyle modifications  See medications in patient instructions if given  Patient will follow up in 6-8 weeks   The above documentation has been reviewed and is accurate and complete Najiyah Paris M Cloee Dunwoody, DO

## 2024-07-21 LAB — PROINSULIN: Proinsulin: 83.8 pmol/L — ABNORMAL HIGH (ref <=18.8)

## 2024-08-12 ENCOUNTER — Other Ambulatory Visit: Payer: Self-pay | Admitting: Family Medicine

## 2024-08-19 ENCOUNTER — Ambulatory Visit: Admitting: Family Medicine

## 2024-08-26 ENCOUNTER — Encounter: Payer: Self-pay | Admitting: Family Medicine

## 2024-08-27 ENCOUNTER — Telehealth: Payer: Self-pay

## 2024-08-27 NOTE — Telephone Encounter (Signed)
 Open in error

## 2024-08-28 NOTE — Addendum Note (Signed)
 Addended by: Jarquis Walker M on: 08/28/2024 11:33 AM   Modules accepted: Orders

## 2024-09-07 ENCOUNTER — Telehealth: Payer: Self-pay

## 2024-09-07 NOTE — Telephone Encounter (Signed)
 Copied from CRM [id number]. Topic: Clinical - Medication Question >> Sep 07, 2024 10:36 AM Suzen RAMAN wrote: Reason for CRM: Patient would like a call back to discuss lisdexamfetamine (VYVANSE ) 40 MG capsule refill.    [phone] (M)  Called pt follow up appt schedule.

## 2024-09-09 ENCOUNTER — Encounter: Payer: Self-pay | Admitting: Family Medicine

## 2024-09-09 ENCOUNTER — Ambulatory Visit: Admitting: Family Medicine

## 2024-09-09 VITALS — BP 118/76 | HR 88 | Temp 98.0°F | Resp 16 | Ht 73.0 in | Wt 231.0 lb

## 2024-09-09 DIAGNOSIS — E559 Vitamin D deficiency, unspecified: Secondary | ICD-10-CM

## 2024-09-09 DIAGNOSIS — F339 Major depressive disorder, recurrent, unspecified: Secondary | ICD-10-CM | POA: Diagnosis not present

## 2024-09-09 DIAGNOSIS — F900 Attention-deficit hyperactivity disorder, predominantly inattentive type: Secondary | ICD-10-CM

## 2024-09-09 DIAGNOSIS — Z23 Encounter for immunization: Secondary | ICD-10-CM | POA: Diagnosis not present

## 2024-09-09 MED ORDER — FLUOXETINE HCL 20 MG PO CAPS: 20.0000 mg | ORAL_CAPSULE | Freq: Every day | ORAL | 3 refills | Status: AC

## 2024-09-09 MED ORDER — LISDEXAMFETAMINE DIMESYLATE 40 MG PO CAPS: 40.0000 mg | ORAL_CAPSULE | ORAL | 0 refills | Status: DC

## 2024-09-09 MED ORDER — VITAMIN D (ERGOCALCIFEROL) 1.25 MG (50000 UNIT) PO CAPS: 50000.0000 [IU] | ORAL_CAPSULE | ORAL | 0 refills | Status: AC

## 2024-09-09 NOTE — Addendum Note (Signed)
 Addended by: Falana Clagg M on: 09/09/2024 08:32 AM   Modules accepted: Orders

## 2024-09-09 NOTE — Progress Notes (Signed)
 Chief Complaint  Patient presents with   Medication Refill    Medication Check    Devin Hoffman is 43 y.o. male here for ADHD follow up.  Patient is currently on Vyvanse  30 mg daily and compliance is excellent. Symptoms are much improved. Side effects include none. Patient believes their dose should be slightly increased. Denies tics, weight loss, difficulties with sleep, self-medication, alcohol/drug abuse, chest pain, or palpitations.  Depression/anxiety Patient has some close family members with health issues.  This has been increasing his anxiety/depression.  He was on Prozac  in the past.  He is currently taking Cymbalta  20 mg daily for chronic pain issues.  This has not helped.  He is interested in going back.  He was previously taking 20 mg daily.  He is seeing a therapist.  No homicidal or suicidal ideation.  No self-medication.   Past Medical History:  Diagnosis Date   Recurrent depression     BP 118/76 (BP Location: Left Arm, Patient Position: Sitting)   Pulse 88   Temp 98 F (36.7 C) (Oral)   Resp 16   Ht 6' 1 (1.854 m)   Wt 231 lb (104.8 kg)   SpO2 97%   BMI 30.48 kg/m  Gen- awake, alert, appearing stated age Heart- RRR Lungs- CTAB, no accessory muscle use Neuro- no facial tics Psych- age appropriate judgment and insight, normal mood and affect  Attention deficit hyperactivity disorder (ADHD), predominantly inattentive type - Plan: lisdexamfetamine (VYVANSE ) 40 MG capsule  Depression, recurrent - Plan: FLUoxetine  (PROZAC ) 20 MG capsule  Vitamin D  deficiency  Chronic, not controlled.  Increase Vyvanse  from 30 mg daily to 40 mg daily.  Follow-up in 1 month. Stop Cymbalta  as this was not helpful.  Start fluoxetine  again at 20 mg daily.  Continue with the counseling team. Restart weekly supplement. I will see him in around 1 month and we will recheck his blood work. Pt voiced understanding and agreement to the plan.  [name],  DO 09/09/24 7:36 AM

## 2024-09-09 NOTE — Patient Instructions (Signed)
 Foods that may reduce pain: 1) Ginger 2) Blueberries 3) Salmon 4) Pumpkin seeds 5) Dark chocolate 6) Turmeric 7) Tart cherries 8) Virgin olive oil 9) Chili peppers 10) Mint 11) Krill oil  Let us  know if you need anything.

## 2024-09-15 NOTE — Progress Notes (Unsigned)
  [facility] [address] [place] [address] Phone: [phone] Subjective:   Devin Hoffman, am serving as a scribe for Dr. Arthea Claudene.  I'm seeing this patient by the request  of:  Frann Mabel Mt, DO  CC: Neck and back pain follow-up  YEP:Dlagzrupcz  Devin Hoffman is a 43 y.o. male coming in with complaint of back and neck pain. OMT 07/20/2024.  Patient has seen primary care recently and discontinued the Cymbalta  and restarted on the fluoxetine .  This was 1 week ago.  Patient states that no change with pain hands and feet.   Taking prozac  and cymbalta .   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    Reviewed prior external information including notes and imaging from previsou exam, outside providers and external EMR if available.   As well as notes that were available from care everywhere and other healthcare systems.  Past medical history, social, surgical and family history all reviewed in electronic medical record.  No pertanent information unless stated regarding to the chief complaint.   Past Medical History:  Diagnosis Date   Recurrent depression     Allergies  Allergen Reactions   Colchicine      Other Reaction(s): myopathy     Review of Systems:  No headache, visual changes, nausea, vomiting, diarrhea, constipation, dizziness, abdominal pain, skin rash, fevers, chills, night sweats, weight loss, swollen lymph nodes, body aches, joint swelling, chest pain, shortness of breath, mood changes. POSITIVE muscle aches  Objective  Blood pressure 122/88, pulse 85, height 6' 1 (1.854 m), weight 230 lb (104.3 kg), SpO2 98%.   General: No apparent distress alert and oriented x3 mood and affect normal, dressed appropriately.  HEENT: Pupils equal, extraocular movements intact  Respiratory: Patient's  speak in full sentences and does not appear short of breath  Cardiovascular: No lower extremity edema, non tender, no erythema        Assessment and Plan:  Bilateral hand pain Bilateral hand bilateral foot pain seems to be more of a neuropathy state at the moment.  We discussed with patient having the insulin  resistance and we consider other medications to see if this and potential causing an inflammatory response.  Discussed icing regimen and home exercises, increase activity slowly.  Discussed icing regimen.  Follow-up with me again in 2 to 3 months to see how patient is responding on metformin at and see if patient can start to discontinue some of his other medications if needed.  This would include the Mirapex  and potentially the Cymbalta .  Right lateral epicondylitis Recurrent problem with it on the contralateral side at the moment.  Increase activity slowly, discussed no overhead lifting for now.  Very similar to the contralateral side.  Follow-up again in 6 to 12 weeks.        The above documentation has been reviewed and is accurate and complete Tanaia Hawkey M Emelyn Roen, DO         Note: This dictation was prepared with Dragon dictation along with smaller phrase technology. Any transcriptional errors that result from this process are unintentional.

## 2024-09-17 ENCOUNTER — Ambulatory Visit (INDEPENDENT_AMBULATORY_CARE_PROVIDER_SITE_OTHER): Admitting: Family Medicine

## 2024-09-17 ENCOUNTER — Encounter: Payer: Self-pay | Admitting: Family Medicine

## 2024-09-17 VITALS — BP 122/88 | HR 85 | Ht 73.0 in | Wt 230.0 lb

## 2024-09-17 DIAGNOSIS — M7711 Lateral epicondylitis, right elbow: Secondary | ICD-10-CM | POA: Diagnosis not present

## 2024-09-17 DIAGNOSIS — M79642 Pain in left hand: Secondary | ICD-10-CM

## 2024-09-17 DIAGNOSIS — M79641 Pain in right hand: Secondary | ICD-10-CM | POA: Diagnosis not present

## 2024-09-17 MED ORDER — METFORMIN HCL 500 MG PO TABS: 500.0000 mg | ORAL_TABLET | Freq: Every day | ORAL | 3 refills | Status: DC

## 2024-09-17 NOTE — Assessment & Plan Note (Signed)
 Bilateral hand bilateral foot pain seems to be more of a neuropathy state at the moment.  We discussed with patient having the insulin  resistance and we consider other medications to see if this and potential causing an inflammatory response.  Discussed icing regimen and home exercises, increase activity slowly.  Discussed icing regimen.  Follow-up with me again in 2 to 3 months to see how patient is responding on metformin at and see if patient can start to discontinue some of his other medications if needed.  This would include the Mirapex  and potentially the Cymbalta .

## 2024-09-17 NOTE — Assessment & Plan Note (Signed)
 Recurrent problem with it on the contralateral side at the moment.  Increase activity slowly, discussed no overhead lifting for now.  Very similar to the contralateral side.  Follow-up again in 6 to 12 weeks.

## 2024-09-17 NOTE — Patient Instructions (Addendum)
 Metformin 500 daily No overhand lifting Ice the elbow at the end of the night See you again in 2-3 months

## 2024-10-12 ENCOUNTER — Ambulatory Visit: Admitting: Family Medicine

## 2024-10-12 ENCOUNTER — Encounter: Payer: Self-pay | Admitting: Family Medicine

## 2024-10-12 VITALS — BP 126/74 | HR 86 | Temp 98.0°F | Resp 16 | Ht 73.0 in | Wt 230.0 lb

## 2024-10-12 DIAGNOSIS — F900 Attention-deficit hyperactivity disorder, predominantly inattentive type: Secondary | ICD-10-CM

## 2024-10-12 DIAGNOSIS — F339 Major depressive disorder, recurrent, unspecified: Secondary | ICD-10-CM | POA: Diagnosis not present

## 2024-10-12 DIAGNOSIS — R7303 Prediabetes: Secondary | ICD-10-CM

## 2024-10-12 MED ORDER — LISDEXAMFETAMINE DIMESYLATE 40 MG PO CAPS: 40.0000 mg | ORAL_CAPSULE | ORAL | 0 refills | Status: AC

## 2024-10-12 NOTE — Patient Instructions (Addendum)
Keep the diet clean and stay active.  Give us 2-3 business days to get the results of your labs back.   Let us know if you need anything.  

## 2024-10-12 NOTE — Progress Notes (Signed)
 Chief Complaint  Patient presents with   Medication Refill    Medication Check    Devin Hoffman is 43 y.o. male here for ADHD follow up.  Patient is currently on Vyvanse  40 mg daily and compliance is excellent. Symptoms are now well-controlled Side effects include: None. Patient believes their dose should be not significantly changed. Denies tics, weight loss, difficulties with sleep, self-medication, alcohol/drug abuse, chest pain, or palpitations.  Depression Pt is currently being treated with Prozac  20 mg daily.  Reports doing better since treatment. No thoughts of harming self or others. No self-medication with alcohol, prescription drugs or illicit drugs. Pt is following with a counselor/psychologist.  Past Medical History:  Diagnosis Date   Recurrent depression     BP 126/74 (BP Location: Left Arm, Patient Position: Sitting)   Pulse 86   Temp 98 F (36.7 C) (Oral)   Resp 16   Ht 6' 1 (1.854 m)   Wt 230 lb (104.3 kg)   SpO2 98%   BMI 30.34 kg/m  Gen- awake, alert, appearing stated age Heart- RRR Lungs- CTAB, no accessory muscle use Neuro- no facial tics Psych- age appropriate judgment and insight, normal mood and affect  Attention deficit hyperactivity disorder (ADHD), predominantly inattentive type - Plan: lisdexamfetamine (VYVANSE ) 40 MG capsule  Depression, recurrent  Prediabetes - Plan: Hemoglobin A1c  Chronic, stable.  Continue Vyvanse  40 mg daily. Chronic, stable.  Continue Prozac  20 mg daily.  Appreciate counseling team. Appreciate sports medicine input.  Reports significant improvement in his pain after starting metformin 500 mg daily.  Some diarrhea but his body may be adjusting.  He will let us  know if he is having issues, we will consider an extended release metformin at the same dosage.  Counseled on diet and exercise. F/u in 6 mo for CPE. Pt voiced understanding and agreement to the plan.  Mabel Mt [place], DO 10/12/24 7:32 AM

## 2024-10-13 ENCOUNTER — Ambulatory Visit: Payer: Self-pay | Admitting: Family Medicine

## 2024-10-13 LAB — HEMOGLOBIN A1C: Hgb A1c MFr Bld: 5.5 % (ref 4.6–6.5)

## 2024-11-16 NOTE — Progress Notes (Signed)
 " [facility] [address] [place] [address] Phone: [phone] Subjective:   Devin Hoffman, am serving as a scribe for Dr. Arthea Claudene.  I'm seeing this patient by the request  of:  Devin Mabel Mt, DO  CC: Right elbow, bilateral hand pain, back pain  YEP:Dlagzrupcz  09/17/2024 Recurrent problem with it on the contralateral side at the moment.  Increase activity slowly, discussed no overhead lifting for now.  Very similar to the contralateral side.  Follow-up again in 6 to 12 weeks.     Bilateral hand bilateral foot pain seems to be more of a neuropathy state at the moment.  We discussed with patient having the insulin  resistance and we consider other medications to see if this and potential causing an inflammatory response.  Discussed icing regimen and home exercises, increase activity slowly.  Discussed icing regimen.  Follow-up with me again in 2 to 3 months to see how patient is responding on metformin  at and see if patient can start to discontinue some of his other medications if needed.  This would include the Mirapex  and potentially the Cymbalta .     Update 11/18/2024 Devin Hoffman is a 43 y.o. male coming in with complaint of R elbow and B hand pain.  Was started on metformin  for the possibility of some of the neuropathy.  Patient states f/u doing much better. No new symptoms.      Past Medical History:  Diagnosis Date   Recurrent depression    Past Surgical History:  Procedure Laterality Date   HAND SURGERY Left    KNEE SURGERY     Social History   Socioeconomic History   Marital status: Married    Spouse name: Not on file   Number of children: Not on file   Years of education: Not on file   Highest education level: Associate degree: academic program  Occupational History   Not on file  Tobacco Use   Smoking status: Former    Current packs/day: 0.00    Types: Cigarettes    Quit date: 2004    Years since  quitting: 21.9    Passive exposure: Never   Smokeless tobacco: Never  Vaping Use   Vaping status: Never Used  Substance and Sexual Activity   Alcohol use: No   Drug use: No   Sexual activity: Yes    Partners: Female    Birth control/protection: None  Other Topics Concern   Not on file  Social History Narrative   Not on file   Social Drivers of Health   Tobacco Use: Medium Risk (10/12/2024)   Patient History    Smoking Tobacco Use: Former    Smokeless Tobacco Use: Never    Passive Exposure: Never  Physicist, Medical Strain: Patient Declined (09/08/2024)   Overall Financial Resource Strain (CARDIA)    Difficulty of Paying Living Expenses: Patient declined  Food Insecurity: Patient Declined (09/08/2024)   Epic    Worried About Programme Researcher, Broadcasting/film/video in the Last Year: Patient declined    Barista in the Last Year: Patient declined  Transportation Needs: Patient Declined (09/08/2024)   Epic    Lack of Transportation (Medical): Patient declined    Lack of Transportation (Non-Medical): Patient declined  Physical Activity: Unknown (09/08/2024)   Exercise Vital Sign    Days of Exercise per Week: Patient declined    Minutes of Exercise per Session: Not on file  Recent Concern: Physical Activity - Insufficiently  Active (07/08/2024)   Exercise Vital Sign    Days of Exercise per Week: 3 days    Minutes of Exercise per Session: 30 min  Stress: Patient Declined (09/08/2024)   [employer] - Occupational Stress Questionnaire    Feeling of Stress: Patient declined  Recent Concern: Stress - Stress Concern Present (07/08/2024)   [employer]ational [employer] - Occupational Stress Questionnaire    Feeling of Stress: To some extent  Social Connections: Unknown (09/08/2024)   Social Connection and Isolation Panel    Frequency of Communication with Friends and Family: Patient declined    Frequency of Social Gatherings with Friends and Family:  Patient declined    Attends Religious Services: Patient declined    Database Administrator or Organizations: Patient declined    Attends Engineer, Structural: Not on file    Marital Status: Married  Recent Concern: Social Connections - Moderately Isolated (07/08/2024)   Social Connection and Isolation Panel    Frequency of Communication with Friends and Family: More than three times a week    Frequency of Social Gatherings with Friends and Family: Once a week    Attends Religious Services: Patient declined    Database Administrator or Organizations: No    Attends Engineer, Structural: Not on file    Marital Status: Married  Depression (PHQ2-9): Low Risk (10/12/2024)   Depression (PHQ2-9)    PHQ-2 Score: 0  Alcohol Screen: Low Risk (09/08/2024)   Alcohol Screen    Last Alcohol Screening Score (AUDIT): 0  Housing: Patient Declined (09/08/2024)   Epic    Unable to Pay for Housing in the Last Year: Patient declined    Number of Times Moved in the Last Year: Not on file    Homeless in the Last Year: Patient declined  Utilities: Not on file  Health Literacy: Not on file   Allergies[1] Family History  Problem Relation Age of Onset   Alcohol abuse Father    Gout Father    Autism spectrum disorder Brother    Colon cancer Maternal Grandmother    Pancreatic cancer Paternal Grandfather     Current Outpatient Medications (Endocrine & Metabolic):    levothyroxine  (SYNTHROID ) 50 MCG tablet, TAKE 1 TABLET BY MOUTH DAILY BEFORE BREAKFAST   metFORMIN  (GLUCOPHAGE ) 500 MG tablet, Take 1 tablet (500 mg total) by mouth daily with breakfast.  Current Outpatient Medications (Cardiovascular):    EPINEPHrine  (EPIPEN  2-PAK) 0.3 mg/0.3 mL IJ SOAJ injection, Inject 0.3 mg into the muscle as needed for anaphylaxis.  Current Outpatient Medications (Other):    FLUoxetine  (PROZAC ) 20 MG capsule, Take 1 capsule (20 mg total) by mouth daily.   lisdexamfetamine  (VYVANSE ) 40 MG capsule,  Take 1 capsule (40 mg total) by mouth every morning.   [START ON 12/11/2024] lisdexamfetamine  (VYVANSE ) 40 MG capsule, Take 1 capsule (40 mg total) by mouth every morning.   lisdexamfetamine  (VYVANSE ) 40 MG capsule, Take 1 capsule (40 mg total) by mouth every morning.   Vitamin D , Ergocalciferol , (DRISDOL ) 1.25 MG (50000 UNIT) CAPS capsule, Take 1 capsule (50,000 Units total) by mouth every 7 (seven) days.   Objective  Blood pressure 110/70, pulse 75, height 6' 1 (1.854 m), weight 227 lb (103 kg), SpO2 98%.   General: No apparent distress alert and oriented x3 mood and affect normal, dressed appropriately.  HEENT: Pupils equal, extraocular movements intact  Respiratory: Patient's speak in full sentences and does not appear short of breath  Cardiovascular: No lower extremity edema, non tender, no erythema  Patient is sitting very comfortably at this point.  Rest of exam deferred.    Impression and Recommendations:    The above documentation has been reviewed and is accurate and complete Devin Herbison M Loys Hoselton, DO        [1]  Allergies Allergen Reactions   Colchicine      Other Reaction(s): myopathy   "

## 2024-11-18 ENCOUNTER — Ambulatory Visit: Admitting: Family Medicine

## 2024-11-18 VITALS — BP 110/70 | HR 75 | Ht 73.0 in | Wt 227.0 lb

## 2024-11-18 DIAGNOSIS — R202 Paresthesia of skin: Secondary | ICD-10-CM | POA: Diagnosis not present

## 2024-11-18 NOTE — Assessment & Plan Note (Signed)
 Significant improvement at this time.  Discussed with patient that with him doing well I think he will continue to do well.  He seems that this is potentially associated with the metformin  doing better.  Discussed the potential for B12 supplementation.  Patient has not asked primary about the extended release for the metformin .  Can follow-up with me as needed

## 2024-11-20 ENCOUNTER — Encounter: Payer: Self-pay | Admitting: Family Medicine

## 2024-11-20 ENCOUNTER — Ambulatory Visit: Admitting: Family Medicine

## 2024-11-20 VITALS — BP 128/84 | HR 84 | Temp 97.7°F | Resp 16 | Ht 73.0 in | Wt 229.2 lb

## 2024-11-20 DIAGNOSIS — G8929 Other chronic pain: Secondary | ICD-10-CM | POA: Diagnosis not present

## 2024-11-20 DIAGNOSIS — R7303 Prediabetes: Secondary | ICD-10-CM | POA: Insufficient documentation

## 2024-11-20 DIAGNOSIS — K9049 Malabsorption due to intolerance, not elsewhere classified: Secondary | ICD-10-CM

## 2024-11-20 MED ORDER — METFORMIN HCL ER 500 MG PO TB24: 500.0000 mg | ORAL_TABLET | Freq: Two times a day (BID) | ORAL | 3 refills | Status: DC

## 2024-11-20 NOTE — Progress Notes (Signed)
 Chief Complaint  Patient presents with   Acute Visit    Patient presents today for a allergy skin testing.    Subjective: Patient is a 43 y.o. male here for follow-up.  Patient has a history of prediabetes and chronic pain.  He was started on metformin  by the sports medicine team.  He reports doing 75% improvement in his pain since starting this.  He is currently taking 500 mg twice daily.  He does get some loose stools when he eats certain things.  He also noticed his pain got worse with certain foods and watermelon while he was on vacation. He is requesting food allergy testing and perhaps longer acting metformin .   Past Medical History:  Diagnosis Date   Anxiety 2015   Recurrent depression     Objective: BP 128/84   Pulse 84   Temp 97.7 F (36.5 C)   Resp 16   Ht 6' 1 (1.854 m)   Wt 229 lb 3.2 oz (104 kg)   SpO2 99%   BMI 30.24 kg/m  General: Awake, appears stated age Lungs: No accessory muscle use Psych: Age appropriate judgment and insight, normal affect and mood  Assessment and Plan: Prediabetes  Other chronic pain  Food intolerance - Plan: Food Allergy Profile  1/2.  Adverse effect of chronic medication.  Stop short acting metformin , transition to metformin  XR 500 mg twice daily.  Let me know if there are cost issues. 3.  Check the above.  He is aware that there will be out-of-pocket cost if he wishes for more in-depth testing this and he would have to go through a different group. The patient voiced understanding and agreement to the plan.  Mabel Mt [place], DO 11/20/2024  12:10 PM

## 2024-11-20 NOTE — Patient Instructions (Addendum)
 Keep the diet clean and stay active.  Give us  4-5 business days to get the results of your labs back.   Heat (pad or rice pillow in microwave) over affected area, 10-15 minutes twice daily.   Ice/cold pack over area for 10-15 min twice daily.  OK to take Tylenol  1000 mg (2 extra strength tabs) or 975 mg (3 regular strength tabs) every 6 hours as needed.  Let us  know if you need anything.  EXERCISES RANGE OF MOTION (ROM) AND STRETCHING EXERCISES  These exercises may help you when beginning to rehabilitate your issue. In order to successfully resolve your symptoms, you must improve your posture. These exercises are designed to help reduce the forward-head and rounded-shoulder posture which contributes to this condition. Your symptoms may resolve with or without further involvement from your physician, physical therapist or athletic trainer. While completing these exercises, remember:  Restoring tissue flexibility helps normal motion to return to the joints. This allows healthier, less painful movement and activity. An effective stretch should be held for at least 20 seconds, although you may need to begin with shorter hold times for comfort. A stretch should never be painful. You should only feel a gentle lengthening or release in the stretched tissue. Do not do any stretch or exercise that you cannot tolerate.  STRETCH- Axial Extensors Lie on your back on the floor. You may bend your knees for comfort. Place a rolled-up hand towel or dish towel, about 2 inches in diameter, under the part of your head that makes contact with the floor. Gently tuck your chin, as if trying to make a double chin, until you feel a gentle stretch at the base of your head. Hold 15-20 seconds. Repeat 2-3 times. Complete this exercise 1 time per day.   STRETCH - Axial Extension  Stand or sit on a firm surface. Assume a good posture: chest up, shoulders drawn back, abdominal muscles slightly tense, knees unlocked  (if standing) and feet hip width apart. Slowly retract your chin so your head slides back and your chin slightly lowers. Continue to look straight ahead. You should feel a gentle stretch in the back of your head. Be certain not to feel an aggressive stretch since this can cause headaches later. Hold for 15-20 seconds. Repeat 2-3 times. Complete this exercise 1 time per day.  STRETCH - Cervical Side Bend  Stand or sit on a firm surface. Assume a good posture: chest up, shoulders drawn back, abdominal muscles slightly tense, knees unlocked (if standing) and feet hip width apart. Without letting your nose or shoulders move, slowly tip your right / left ear to your shoulder until your feel a gentle stretch in the muscles on the opposite side of your neck. Hold 15-20 seconds. Repeat 2-3 times. Complete this exercise 1-2 times per day.  STRETCH - Cervical Rotators  Stand or sit on a firm surface. Assume a good posture: chest up, shoulders drawn back, abdominal muscles slightly tense, knees unlocked (if standing) and feet hip width apart. Keeping your eyes level with the ground, slowly turn your head until you feel a gentle stretch along the back and opposite side of your neck. Hold 15-20 seconds. Repeat 2-3 times. Complete this exercise 1-2 times per day.  RANGE OF MOTION - Neck Circles  Stand or sit on a firm surface. Assume a good posture: chest up, shoulders drawn back, abdominal muscles slightly tense, knees unlocked (if standing) and feet hip width apart. Gently roll your head down and around  from the back of one shoulder to the back of the other. The motion should never be forced or painful. Repeat the motion 10-20 times, or until you feel the neck muscles relax and loosen. Repeat 2-3 times. Complete the exercise 1-2 times per day. STRENGTHENING EXERCISES - Cervical Strain and Sprain These exercises may help you when beginning to rehabilitate your injury. They may resolve your symptoms with  or without further involvement from your physician, physical therapist, or athletic trainer. While completing these exercises, remember:  Muscles can gain both the endurance and the strength needed for everyday activities through controlled exercises. Complete these exercises as instructed by your physician, physical therapist, or athletic trainer. Progress the resistance and repetitions only as guided. You may experience muscle soreness or fatigue, but the pain or discomfort you are trying to eliminate should never worsen during these exercises. If this pain does worsen, stop and make certain you are following the directions exactly. If the pain is still present after adjustments, discontinue the exercise until you can discuss the trouble with your clinician.  STRENGTH - Cervical Flexors, Isometric Face a wall, standing about 6 inches away. Place a small pillow, a ball about 6-8 inches in diameter, or a folded towel between your forehead and the wall. Slightly tuck your chin and gently push your forehead into the soft object. Push only with mild to moderate intensity, building up tension gradually. Keep your jaw and forehead relaxed. Hold 10 to 20 seconds. Keep your breathing relaxed. Release the tension slowly. Relax your neck muscles completely before you start the next repetition. Repeat 2-3 times. Complete this exercise 1 time per day.  STRENGTH- Cervical Lateral Flexors, Isometric  Stand about 6 inches away from a wall. Place a small pillow, a ball about 6-8 inches in diameter, or a folded towel between the side of your head and the wall. Slightly tuck your chin and gently tilt your head into the soft object. Push only with mild to moderate intensity, building up tension gradually. Keep your jaw and forehead relaxed. Hold 10 to 20 seconds. Keep your breathing relaxed. Release the tension slowly. Relax your neck muscles completely before you start the next repetition. Repeat 2-3 times. Complete  this exercise 1 time per day.  STRENGTH - Cervical Extensors, Isometric  Stand about 6 inches away from a wall. Place a small pillow, a ball about 6-8 inches in diameter, or a folded towel between the back of your head and the wall. Slightly tuck your chin and gently tilt your head back into the soft object. Push only with mild to moderate intensity, building up tension gradually. Keep your jaw and forehead relaxed. Hold 10 to 20 seconds. Keep your breathing relaxed. Release the tension slowly. Relax your neck muscles completely before you start the next repetition. Repeat 2-3 times. Complete this exercise 1 time per day.  POSTURE AND BODY MECHANICS CONSIDERATIONS Keeping correct posture when sitting, standing or completing your activities will reduce the stress put on different body tissues, allowing injured tissues a chance to heal and limiting painful experiences. The following are general guidelines for improved posture. Your physician or physical therapist will provide you with any instructions specific to your needs. While reading these guidelines, remember: The exercises prescribed by your provider will help you have the flexibility and strength to maintain correct postures. The correct posture provides the optimal environment for your joints to work. All of your joints have less wear and tear when properly supported by a spine with  good posture. This means you will experience a healthier, less painful body. Correct posture must be practiced with all of your activities, especially prolonged sitting and standing. Correct posture is as important when doing repetitive low-stress activities (typing) as it is when doing a single heavy-load activity (lifting).  PROLONGED STANDING WHILE SLIGHTLY LEANING FORWARD When completing a task that requires you to lean forward while standing in one place for a long time, place either foot up on a stationary 2- to 4-inch high object to help maintain the best  posture. When both feet are on the ground, the low back tends to lose its slight inward curve. If this curve flattens (or becomes too large), then the back and your other joints will experience too much stress, fatigue more quickly, and can cause pain.   RESTING POSITIONS Consider which positions are most painful for you when choosing a resting position. If you have pain with flexion-based activities (sitting, bending, stooping, squatting), choose a position that allows you to rest in a less flexed posture. You would want to avoid curling into a fetal position on your side. If your pain worsens with extension-based activities (prolonged standing, working overhead), avoid resting in an extended position such as sleeping on your stomach. Most people will find more comfort when they rest with their spine in a more neutral position, neither too rounded nor too arched. Lying on a non-sagging bed on your side with a pillow between your knees, or on your back with a pillow under your knees will often provide some relief. Keep in mind, being in any one position for a prolonged period of time, no matter how correct your posture, can still lead to stiffness.  WALKING Walk with an upright posture. Your ears, shoulders, and hips should all line up. OFFICE WORK When working at a desk, create an environment that supports good, upright posture. Without extra support, muscles fatigue and lead to excessive strain on joints and other tissues.  CHAIR: A chair should be able to slide under your desk when your back makes contact with the back of the chair. This allows you to work closely. The chair's height should allow your eyes to be level with the upper part of your monitor and your hands to be slightly lower than your elbows. Body position: Your feet should make contact with the floor. If this is not possible, use a foot rest. Keep your ears over your shoulders. This will reduce stress on your neck and low back.

## 2024-11-25 ENCOUNTER — Ambulatory Visit: Payer: Self-pay | Admitting: Family Medicine

## 2024-11-25 LAB — FOOD ALLERGY PROFILE
"Macadamia Nut ": <0.1 kU/L
"Sesame Seed f10 ": <0.1 kU/L
Allergen, Salmon, f41: <0.1 kU/L
Almonds: <0.1 kU/L
Brazil Nut: <0.1 kU/L
CLASS: 0
CLASS: 0
CLASS: 0
CLASS: 0
CLASS: 0
CLASS: 0
CLASS: 0
CLASS: 0
CLASS: 0
CLASS: 0
CLASS: 0
CLASS: 0
Cashew IgE: <0.1 kU/L
Class: 0
Class: 0
Class: 0
Class: 0
Class: 0
Egg White IgE: <0.1 kU/L
Fish Cod: <0.1 kU/L
Hazelnut: <0.1 kU/L
Milk IgE: <0.1 kU/L
Peanut IgE: <0.1 kU/L
Scallop IgE: <0.1 kU/L
Shrimp IgE: <0.1 kU/L
Soybean IgE: <0.1 kU/L
Tuna IgE: <0.1 kU/L
Walnut: <0.1 kU/L
Wheat IgE: <0.1 kU/L

## 2024-11-25 LAB — INTERPRETATION:

## 2024-12-07 ENCOUNTER — Other Ambulatory Visit: Payer: Self-pay | Admitting: Family Medicine

## 2024-12-07 ENCOUNTER — Encounter: Payer: Self-pay | Admitting: Family Medicine

## 2024-12-07 MED ORDER — METFORMIN HCL 500 MG PO TABS: 500.0000 mg | ORAL_TABLET | Freq: Every day | ORAL | 3 refills | Status: AC
